# Patient Record
Sex: Female | Born: 1955 | Race: White | Hispanic: No | Marital: Married | Smoking: Never smoker
Health system: Southern US, Community
[De-identification: ages and names within clinical notes are randomized; demographics above are authoritative.]

## PROBLEM LIST (undated history)

## (undated) DIAGNOSIS — M1990 Unspecified osteoarthritis, unspecified site: Secondary | ICD-10-CM

## (undated) DIAGNOSIS — E785 Hyperlipidemia, unspecified: Secondary | ICD-10-CM

## (undated) DIAGNOSIS — F419 Anxiety disorder, unspecified: Secondary | ICD-10-CM

## (undated) DIAGNOSIS — C801 Malignant (primary) neoplasm, unspecified: Secondary | ICD-10-CM

## (undated) DIAGNOSIS — J189 Pneumonia, unspecified organism: Secondary | ICD-10-CM

## (undated) DIAGNOSIS — F32A Depression, unspecified: Secondary | ICD-10-CM

## (undated) DIAGNOSIS — K429 Umbilical hernia without obstruction or gangrene: Secondary | ICD-10-CM

## (undated) DIAGNOSIS — M810 Age-related osteoporosis without current pathological fracture: Secondary | ICD-10-CM

## (undated) HISTORY — DX: Age-related osteoporosis without current pathological fracture: M81.0

## (undated) HISTORY — DX: Hyperlipidemia, unspecified: E78.5

## (undated) HISTORY — DX: Anxiety disorder, unspecified: F41.9

## (undated) HISTORY — PX: NO PAST SURGERIES: SHX2092

## (undated) HISTORY — PX: COLONOSCOPY: SHX174

---

## 1978-12-22 HISTORY — PX: DIAGNOSTIC LAPAROSCOPY: SUR761

## 2021-05-21 ENCOUNTER — Encounter: Payer: Self-pay | Admitting: Adult Health

## 2021-05-21 ENCOUNTER — Ambulatory Visit (INDEPENDENT_AMBULATORY_CARE_PROVIDER_SITE_OTHER): Payer: Medicare Other | Admitting: Adult Health

## 2021-05-21 ENCOUNTER — Other Ambulatory Visit: Payer: Self-pay

## 2021-05-21 VITALS — BP 124/78 | HR 68 | Temp 98.2°F | Ht 67.0 in | Wt 178.4 lb

## 2021-05-21 DIAGNOSIS — F339 Major depressive disorder, recurrent, unspecified: Secondary | ICD-10-CM

## 2021-05-21 DIAGNOSIS — I5041 Acute combined systolic (congestive) and diastolic (congestive) heart failure: Secondary | ICD-10-CM

## 2021-05-21 DIAGNOSIS — R239 Unspecified skin changes: Secondary | ICD-10-CM | POA: Diagnosis not present

## 2021-05-21 DIAGNOSIS — M069 Rheumatoid arthritis, unspecified: Secondary | ICD-10-CM | POA: Diagnosis not present

## 2021-05-21 DIAGNOSIS — E559 Vitamin D deficiency, unspecified: Secondary | ICD-10-CM

## 2021-05-21 DIAGNOSIS — Z1231 Encounter for screening mammogram for malignant neoplasm of breast: Secondary | ICD-10-CM | POA: Diagnosis not present

## 2021-05-21 DIAGNOSIS — E785 Hyperlipidemia, unspecified: Secondary | ICD-10-CM

## 2021-05-21 DIAGNOSIS — L570 Actinic keratosis: Secondary | ICD-10-CM | POA: Diagnosis not present

## 2021-05-21 DIAGNOSIS — I341 Nonrheumatic mitral (valve) prolapse: Secondary | ICD-10-CM | POA: Insufficient documentation

## 2021-05-21 DIAGNOSIS — F320 Major depressive disorder, single episode, mild: Secondary | ICD-10-CM

## 2021-05-21 DIAGNOSIS — M8580 Other specified disorders of bone density and structure, unspecified site: Secondary | ICD-10-CM | POA: Diagnosis not present

## 2021-05-21 NOTE — Progress Notes (Signed)
New Patient Office Visit  Subjective:  Patient ID: Misty Walker, female    DOB: 1956/11/09  Age: 65 y.o. MRN: [id number]  CC:  Chief Complaint  Patient presents with  . New Patient (Initial Visit)    HPI [name] presents for establishment of care. She has a spot on her chest and right arm. Chest area present x 4 years and right arm for about 6 months. No itching. Denies any bleeding. Scales over and then goes away.   She recently moved here from [place].    She has had nerve conduction study on left big toe and leg and noted nerve in leg was not firing. She did physical therapy and can now move her toes. Nerve conduction.   Fluoxetine 10 mg once daily. She has been on this for a while now.  Atenolol 25 mg once daily for MVP she has not seen cardiology in 20 years and ECHO was unchanged, 1 year ago per patient. Denies any shortness of breath. Denies any chest pain.  simivastin 5 mg po qd for hyperlipidemia.  [facility] [place] [facility]   Mammogram 2 years ago.   Colonoscopy was 2014. Did not go.   Patient  denies any fever, body aches,chills, rash, chest pain, shortness of breath, nausea, vomiting, or diarrhea.  Denies dizziness, lightheadedness, pre syncopal or syncopal episodes.     History reviewed. No pertinent past medical history.  History reviewed. No pertinent surgical history.  Family History  Problem Relation Age of Onset  . Hypertension Mother     Social History   Socioeconomic History  . Marital status: Married    Spouse name: Not on file  . Number of children: Not on file  . Years of education: Not on file  . Highest education level: Not on file  Occupational History  . Not on file  Tobacco Use  . Smoking status: Never Smoker  . Smokeless tobacco: Never Used  Substance and Sexual Activity  . Alcohol use: Not on file  . Drug use: Never  . Sexual activity: Yes  Other Topics Concern  . Not on file  Social History Narrative  . Not on file    Social Determinants of Health   Financial Resource Strain: Not on file  Food Insecurity: Not on file  Transportation Needs: Not on file  Physical Activity: Not on file  Stress: Not on file  Social Connections: Not on file  Intimate Partner Violence: Not on file    ROS Review of Systems  Constitutional: Negative.   HENT: Negative.   Cardiovascular: Negative.   Gastrointestinal: Negative.   Genitourinary: Negative.   Musculoskeletal: Negative.   Skin: Positive for color change.  Hematological: Negative.   Psychiatric/Behavioral: Negative.     Objective:   Today's Vitals: BP 124/78   Pulse 68   Temp 98.2 F (36.8 C)   Ht 5\' 7"  (1.702 m)   Wt 178 lb 6.4 oz (80.9 kg)   SpO2 95%   BMI 27.94 kg/m   Physical Exam Vitals reviewed.  Constitutional:      General: She is not in acute distress.    Appearance: She is well-developed. She is not diaphoretic.     Interventions: She is not intubated. HENT:     Head: Normocephalic and atraumatic.     Right Ear: External ear normal.     Left Ear: External ear normal.     Nose: Nose normal.     Mouth/Throat:     Pharynx:  No oropharyngeal exudate.  Eyes:     General: Lids are normal. No scleral icterus.       Right eye: No discharge.        Left eye: No discharge.     Conjunctiva/sclera: Conjunctivae normal.     Right eye: Right conjunctiva is not injected. No exudate or hemorrhage.    Left eye: Left conjunctiva is not injected. No exudate or hemorrhage.    Pupils: Pupils are equal, round, and reactive to light.  Neck:     Thyroid: No thyroid mass or thyromegaly.     Vascular: Normal carotid pulses. No carotid bruit, hepatojugular reflux or JVD.     Trachea: Trachea and phonation normal. No tracheal tenderness or tracheal deviation.     Meningeal: Brudzinski's sign and Kernig's sign absent.  Cardiovascular:     Rate and Rhythm: Normal rate and regular rhythm.     Pulses: Normal pulses.          Radial pulses are 2+ on  the right side and 2+ on the left side.       Dorsalis pedis pulses are 2+ on the right side and 2+ on the left side.       Posterior tibial pulses are 2+ on the right side and 2+ on the left side.     Heart sounds: S1 normal and S2 normal. Heart sounds not distant. Murmur (mid systolic murmur with click ) heard.  No friction rub. No gallop.   Pulmonary:     Effort: Pulmonary effort is normal. No tachypnea, bradypnea, accessory muscle usage or respiratory distress. She is not intubated.     Breath sounds: Normal breath sounds. No stridor. No wheezing or rales.  Chest:     Chest wall: No tenderness.  Breasts:     Right: No supraclavicular adenopathy.     Left: No supraclavicular adenopathy.    Abdominal:     General: Bowel sounds are normal. There is no distension or abdominal bruit.     Palpations: Abdomen is soft. There is no shifting dullness, fluid wave, hepatomegaly, splenomegaly, mass or pulsatile mass.     Tenderness: There is no abdominal tenderness. There is no guarding or rebound.     Hernia: No hernia is present.  Musculoskeletal:        General: No tenderness or deformity. Normal range of motion.     Cervical back: Full passive range of motion without pain, normal range of motion and neck supple. No edema, erythema or rigidity. No spinous process tenderness or muscular tenderness. Normal range of motion.     Right lower leg: 1+ Pitting Edema present.     Left lower leg: 1+ Pitting Edema present.  Lymphadenopathy:     Head:     Right side of head: No submental, submandibular, tonsillar, preauricular, posterior auricular or occipital adenopathy.     Left side of head: No submental, submandibular, tonsillar, preauricular, posterior auricular or occipital adenopathy.     Cervical: No cervical adenopathy.     Right cervical: No superficial, deep or posterior cervical adenopathy.    Left cervical: No superficial, deep or posterior cervical adenopathy.     Upper Body:     Right  upper body: No supraclavicular or pectoral adenopathy.     Left upper body: No supraclavicular or pectoral adenopathy.  Skin:    General: Skin is warm and dry.     Coloration: Skin is not pale.     Findings: No abrasion, bruising, burn,  ecchymosis, erythema, lesion, petechiae or rash.     Nails: There is no clubbing.       Neurological:     Mental Status: She is alert and oriented to person, place, and time.     GCS: GCS eye subscore is 4. GCS verbal subscore is 5. GCS motor subscore is 6.     Cranial Nerves: No cranial nerve deficit.     Sensory: No sensory deficit.     Motor: No tremor, atrophy, abnormal muscle tone or seizure activity.     Coordination: Coordination normal.     Gait: Gait normal.     Deep Tendon Reflexes: Reflexes are normal and symmetric. Reflexes normal. Babinski sign absent on the right side. Babinski sign absent on the left side.     Reflex Scores:      Tricep reflexes are 2+ on the right side and 2+ on the left side.      Bicep reflexes are 2+ on the right side and 2+ on the left side.      Brachioradialis reflexes are 2+ on the right side and 2+ on the left side.      Patellar reflexes are 2+ on the right side and 2+ on the left side.      Achilles reflexes are 2+ on the right side and 2+ on the left side. Psychiatric:        Speech: Speech normal.        Behavior: Behavior normal.        Thought Content: Thought content normal.        Judgment: Judgment normal.     Assessment & Plan:   Problem List Items Addressed This Visit      Cardiovascular and Mediastinum   Acute combined systolic and diastolic heart failure ([place])   Relevant Orders   Ambulatory referral to Cardiology     Musculoskeletal and Integument   Rheumatoid arthritis ([place])     Other   Depression, recurrent ([place])    Other Visit Diagnoses    Hyperlipidemia, unspecified hyperlipidemia type    -  Primary   Relevant Orders   Lipid Panel w/o Chol/HDL Ratio   Screening mammogram,  encounter for       Relevant Orders   MM Digital Diagnostic Bilat   Mitral valve prolapse       Relevant Orders   VITAMIN D 25 Hydroxy (Vit-D Deficiency, Fractures)   CBC with Differential/Platelet   Comprehensive metabolic panel   Ambulatory referral to Cardiology   Osteopenia, unspecified location       Relevant Orders   DG Bone Density   Vitamin D deficiency       Depression, major, single episode, mild ([facility])       Relevant Orders   VITAMIN D 25 Hydroxy (Vit-D Deficiency, Fractures)   CBC with Differential/Platelet   Comprehensive metabolic panel   TSH   Actinic keratoses       Relevant Orders   Ambulatory referral to Dermatology   Abnormal appearance of skin       Relevant Orders   Ambulatory referral to Dermatology      Outpatient Encounter Medications as of 05/21/2021  Medication Sig  . [DISCONTINUED] atenolol (TENORMIN) 25 MG tablet Take by mouth daily.  . [DISCONTINUED] FLUoxetine (PROZAC) 10 MG tablet Take 10 mg by mouth daily.  . [DISCONTINUED] simvastatin (ZOCOR) 5 MG tablet Take 5 mg by mouth daily.   No facility-administered encounter medications on file as of 05/21/2021.  Orders Placed This Encounter  Procedures  . MM Digital Diagnostic Bilat    Order Specific Question:   Reason for Exam (SYMPTOM  OR DIAGNOSIS REQUIRED)    Answer:   screening.    Order Specific Question:   Preferred imaging location?    Answer:   [facility]  . DG Bone Density    Order Specific Question:   Reason for Exam (SYMPTOM  OR DIAGNOSIS REQUIRED)    Answer:   screening has osteopenia.    Order Specific Question:   Preferred imaging location?    Answer:   [facility]  . VITAMIN D 25 Hydroxy (Vit-D Deficiency, Fractures)    Standing Status:   Future    Standing Expiration Date:   05/21/2022  . CBC with Differential/Platelet    Standing Status:   Future    Standing Expiration Date:   05/21/2022  . Comprehensive metabolic panel    Standing Status:   Future     Standing Expiration Date:   05/21/2022  . TSH    Standing Status:   Future    Standing Expiration Date:   05/21/2022  . Lipid Panel w/o Chol/HDL Ratio    Standing Status:   Future    Standing Expiration Date:   05/21/2022  . Ambulatory referral to Dermatology    Referral Priority:   Urgent    Referral Type:   Consultation    Referral Reason:   Specialty Services Required    Referred to Provider:   Jannet Mantis, MD    Requested Specialty:   Dermatology    Number of Visits Requested:   1  . Ambulatory referral to Cardiology    Referral Priority:   Routine    Referral Type:   Consultation    Referral Reason:   Specialty Services Required    Requested Specialty:   Cardiology    Number of Visits Requested:   1    Follow-up: Return in about 4 months (around 09/20/2021), or if symptoms worsen or fail to improve, for at any time for any worsening symptoms, Go to Emergency room/ urgent care if worse.   Marcille Buffy, FNP

## 2021-05-21 NOTE — Patient Instructions (Signed)
Call to schedule your screening mammogram. Your orders have been placed for your exam.  Let our office know if you have questions, concerns, or any difficulty scheduling.  If normal results then yearly screening mammograms are recommended unless you notice  Changes in your breast then you should schedule a follow up office visit. If abnormal results  Further imaging will be warranted and sooner follow up as determined by the radiologist at the [facility].   [facility] at [facility] [place], [place] [address]  Main: [phone]    Health Maintenance, Female Adopting a healthy lifestyle and getting preventive care are important in promoting health and wellness. Ask your health care provider about:  The right schedule for you to have regular tests and exams.  Things you can do on your own to prevent diseases and keep yourself healthy. What should I know about diet, weight, and exercise? Eat a healthy diet  Eat a diet that includes plenty of vegetables, fruits, low-fat dairy products, and lean protein.  Do not eat a lot of foods that are high in solid fats, added sugars, or sodium.   Maintain a healthy weight Body mass index (BMI) is used to identify weight problems. It estimates body fat based on height and weight. Your health care provider can help determine your BMI and help you achieve or maintain a healthy weight. Get regular exercise Get regular exercise. This is one of the most important things you can do for your health. Most adults should:  Exercise for at least 150 minutes each week. The exercise should increase your heart rate and make you sweat (moderate-intensity exercise).  Do strengthening exercises at least twice a week. This is in addition to the moderate-intensity exercise.  Spend less time sitting. Even light physical activity can be beneficial. Watch cholesterol and blood lipids Have your blood tested for lipids and  cholesterol at 67 years of age, then have this test every 5 years. Have your cholesterol levels checked more often if:  Your lipid or cholesterol levels are high.  You are older than 67 years of age.  You are at high risk for heart disease. What should I know about cancer screening? Depending on your health history and family history, you may need to have cancer screening at various ages. This may include screening for:  Breast cancer.  Cervical cancer.  Colorectal cancer.  Skin cancer.  Lung cancer. What should I know about heart disease, diabetes, and high blood pressure? Blood pressure and heart disease  High blood pressure causes heart disease and increases the risk of stroke. This is more likely to develop in people who have high blood pressure readings, are of African descent, or are overweight.  Have your blood pressure checked: ? Every 3-5 years if you are 15-28 years of age. ? Every year if you are 82 years old or older. Diabetes Have regular diabetes screenings. This checks your fasting blood sugar level. Have the screening done:  Once every three years after age 25 if you are at a normal weight and have a low risk for diabetes.  More often and at a younger age if you are overweight or have a high risk for diabetes. What should I know about preventing infection? Hepatitis B If you have a higher risk for hepatitis B, you should be screened for this virus. Talk with your health care provider to find out if you are at risk for hepatitis B  infection. Hepatitis C Testing is recommended for:  Everyone born from [birth year] through 1967.  Anyone with known risk factors for hepatitis C. Sexually transmitted infections (STIs)  Get screened for STIs, including gonorrhea and chlamydia, if: ? You are sexually active and are younger than 67 years of age. ? You are older than 66 years of age and your health care provider tells you that you are at risk for this type of  infection. ? Your sexual activity has changed since you were last screened, and you are at increased risk for chlamydia or gonorrhea. Ask your health care provider if you are at risk.  Ask your health care provider about whether you are at high risk for HIV. Your health care provider may recommend a prescription medicine to help prevent HIV infection. If you choose to take medicine to prevent HIV, you should first get tested for HIV. You should then be tested every 3 months for as long as you are taking the medicine. Pregnancy  If you are about to stop having your period (premenopausal) and you may become pregnant, seek counseling before you get pregnant.  Take 400 to 800 micrograms (mcg) of folic acid every day if you become pregnant.  Ask for birth control (contraception) if you want to prevent pregnancy. Osteoporosis and menopause Osteoporosis is a disease in which the bones lose minerals and strength with aging. This can result in bone fractures. If you are 70 years old or older, or if you are at risk for osteoporosis and fractures, ask your health care provider if you should:  Be screened for bone loss.  Take a calcium or vitamin D supplement to lower your risk of fractures.  Be given hormone replacement therapy (HRT) to treat symptoms of menopause. Follow these instructions at home: Lifestyle  Do not use any products that contain nicotine or tobacco, such as cigarettes, e-cigarettes, and chewing tobacco. If you need help quitting, ask your health care provider.  Do not use street drugs.  Do not share needles.  Ask your health care provider for help if you need support or information about quitting drugs. Alcohol use  Do not drink alcohol if: ? Your health care provider tells you not to drink. ? You are pregnant, may be pregnant, or are planning to become pregnant.  If you drink alcohol: ? Limit how much you use to 0-1 drink a day. ? Limit intake if you are  breastfeeding.  Be aware of how much alcohol is in your drink. In the [place] one drink equals one 12 oz bottle of beer (355 mL), one 5 oz glass of wine (148 mL), or one 1 oz glass of hard liquor (44 mL). General instructions  Schedule regular health, dental, and eye exams.  Stay current with your vaccines.  Tell your health care provider if: ? You often feel depressed. ? You have ever been abused or do not feel safe at home. Summary  Adopting a healthy lifestyle and getting preventive care are important in promoting health and wellness.  Follow your health care provider's instructions about healthy diet, exercising, and getting tested or screened for diseases.  Follow your health care provider's instructions on monitoring your cholesterol and blood pressure. This information is not intended to replace advice given to you by your health care provider. Make sure you discuss any questions you have with your health care provider. Document Revised: 12/01/2018 Document Reviewed: 12/01/2018 Elsevier Patient Education  2021 [employer].

## 2021-05-22 ENCOUNTER — Telehealth: Payer: Self-pay | Admitting: Adult Health

## 2021-05-22 ENCOUNTER — Encounter: Payer: Self-pay | Admitting: Adult Health

## 2021-05-22 MED ORDER — SIMVASTATIN 5 MG PO TABS: 5.0000 mg | ORAL_TABLET | Freq: Every day | ORAL | 1 refills | Status: DC

## 2021-05-22 MED ORDER — ATENOLOL 25 MG PO TABS: 25.0000 mg | ORAL_TABLET | Freq: Every day | ORAL | 1 refills | Status: DC

## 2021-05-22 MED ORDER — FLUOXETINE HCL 10 MG PO TABS: 10.0000 mg | ORAL_TABLET | Freq: Every day | ORAL | 1 refills | Status: DC

## 2021-05-22 NOTE — Addendum Note (Signed)
Addended by: Nanci Pina on: 05/22/2021 12:24 PM   Modules accepted: Orders

## 2021-05-22 NOTE — Progress Notes (Signed)
Updates were made. Provider was able to close chart.

## 2021-05-22 NOTE — Telephone Encounter (Signed)
Refill sent.

## 2021-05-22 NOTE — Telephone Encounter (Signed)
Patient established with NP on 05/21/21 okay to fill the listed meds?

## 2021-05-22 NOTE — Telephone Encounter (Signed)
Ok to fill 90 day supply. Thanks

## 2021-05-22 NOTE — Telephone Encounter (Signed)
Pt needs refill on the following [facility]  atenolol (TENORMIN) 25 MG tablet FLUoxetine (PROZAC) 10 MG tablet simvastatin (ZOCOR) 5 MG tablet

## 2021-05-24 ENCOUNTER — Telehealth: Payer: Self-pay

## 2021-05-24 NOTE — Telephone Encounter (Signed)
Returned pt call  

## 2021-05-24 NOTE — Telephone Encounter (Signed)
Left message to call back about cardiology referral.

## 2021-05-24 NOTE — Telephone Encounter (Signed)
Pt returned your call.  

## 2021-05-27 ENCOUNTER — Other Ambulatory Visit: Payer: Self-pay | Admitting: Adult Health

## 2021-05-27 DIAGNOSIS — Z1231 Encounter for screening mammogram for malignant neoplasm of breast: Secondary | ICD-10-CM

## 2021-05-30 DIAGNOSIS — D0461 Carcinoma in situ of skin of right upper limb, including shoulder: Secondary | ICD-10-CM | POA: Diagnosis not present

## 2021-05-31 ENCOUNTER — Other Ambulatory Visit: Payer: Self-pay

## 2021-05-31 ENCOUNTER — Other Ambulatory Visit (INDEPENDENT_AMBULATORY_CARE_PROVIDER_SITE_OTHER): Payer: Medicare Other

## 2021-05-31 DIAGNOSIS — I341 Nonrheumatic mitral (valve) prolapse: Secondary | ICD-10-CM | POA: Diagnosis not present

## 2021-05-31 DIAGNOSIS — E785 Hyperlipidemia, unspecified: Secondary | ICD-10-CM

## 2021-05-31 DIAGNOSIS — L821 Other seborrheic keratosis: Secondary | ICD-10-CM | POA: Diagnosis not present

## 2021-05-31 DIAGNOSIS — F320 Major depressive disorder, single episode, mild: Secondary | ICD-10-CM

## 2021-05-31 DIAGNOSIS — D485 Neoplasm of uncertain behavior of skin: Secondary | ICD-10-CM | POA: Diagnosis not present

## 2021-05-31 LAB — CBC WITH DIFFERENTIAL/PLATELET
Basophils Absolute: 0.1 10*3/uL (ref 0.0–0.1)
Basophils Relative: 0.8 % (ref 0.0–3.0)
Eosinophils Absolute: 0.3 10*3/uL (ref 0.0–0.7)
Eosinophils Relative: 3 % (ref 0.0–5.0)
HCT: 41.8 % (ref 36.0–46.0)
Hemoglobin: 14.2 g/dL (ref 12.0–15.0)
Lymphocytes Relative: 20.1 % (ref 12.0–46.0)
Lymphs Abs: 1.7 10*3/uL (ref 0.7–4.0)
MCHC: 34 g/dL (ref 30.0–36.0)
MCV: 93.6 fl (ref 78.0–100.0)
Monocytes Absolute: 0.8 10*3/uL (ref 0.1–1.0)
Monocytes Relative: 9.7 % (ref 3.0–12.0)
Neutro Abs: 5.6 10*3/uL (ref 1.4–7.7)
Neutrophils Relative %: 66.4 % (ref 43.0–77.0)
Platelets: 300 10*3/uL (ref 150.0–400.0)
RBC: 4.47 Mil/uL (ref 3.87–5.11)
RDW: 12.7 % (ref 11.5–15.5)
WBC: 8.5 10*3/uL (ref 4.0–10.5)

## 2021-05-31 LAB — COMPREHENSIVE METABOLIC PANEL
ALT: 22 U/L (ref 0–35)
AST: 20 U/L (ref 0–37)
Albumin: 4.3 g/dL (ref 3.5–5.2)
Alkaline Phosphatase: 78 U/L (ref 39–117)
BUN: 14 mg/dL (ref 6–23)
CO2: 26 mEq/L (ref 19–32)
Calcium: 8.9 mg/dL (ref 8.4–10.5)
Chloride: 101 mEq/L (ref 96–112)
Creatinine, Ser: 0.77 mg/dL (ref 0.40–1.20)
GFR: 79.96 mL/min (ref >60.00)
Glucose, Bld: 90 mg/dL (ref 70–99)
Potassium: 3.9 mEq/L (ref 3.5–5.1)
Sodium: 136 mEq/L (ref 135–145)
Total Bilirubin: 0.6 mg/dL (ref 0.2–1.2)
Total Protein: 6.6 g/dL (ref 6.0–8.3)

## 2021-05-31 LAB — TSH: TSH: 2 u[IU]/mL (ref 0.35–4.50)

## 2021-05-31 LAB — VITAMIN D 25 HYDROXY (VIT D DEFICIENCY, FRACTURES): VITD: 40.09 ng/mL (ref 30.00–100.00)

## 2021-06-01 LAB — LIPID PANEL W/O CHOL/HDL RATIO
Cholesterol, Total: 160 mg/dL (ref 100–199)
HDL: 57 mg/dL (ref >39)
LDL Chol Calc (NIH): 88 mg/dL (ref 0–99)
Triglycerides: 76 mg/dL (ref 0–149)
VLDL Cholesterol Cal: 15 mg/dL (ref 5–40)

## 2021-06-03 NOTE — Progress Notes (Signed)
All labs that were checked are within normal limits.  Cholesterol within normal limits.  CBC is within normal limits.  CMP is within normal limits.  Vitamin D level is within normal limits.  TSH for thyroid is within normal limits.

## 2021-06-20 DIAGNOSIS — C44622 Squamous cell carcinoma of skin of right upper limb, including shoulder: Secondary | ICD-10-CM | POA: Diagnosis not present

## 2021-06-27 ENCOUNTER — Encounter: Payer: Self-pay | Admitting: Cardiology

## 2021-06-27 ENCOUNTER — Other Ambulatory Visit: Payer: Self-pay

## 2021-06-27 ENCOUNTER — Ambulatory Visit: Payer: Medicare Other | Admitting: Cardiology

## 2021-06-27 VITALS — BP 114/74 | HR 67 | Ht 67.0 in | Wt 179.0 lb

## 2021-06-27 DIAGNOSIS — Z8679 Personal history of other diseases of the circulatory system: Secondary | ICD-10-CM | POA: Diagnosis not present

## 2021-06-27 DIAGNOSIS — E7800 Pure hypercholesterolemia, unspecified: Secondary | ICD-10-CM

## 2021-06-27 NOTE — Progress Notes (Signed)
Cardiology Office Note:    Date:  06/27/2021   ID:  Misty Walker, DOB December 25, 1955, MRN [id number]  PCP:  Doreen Beam, FNP   [facility] Providers Cardiologist:  Kate Sable, MD     Referring MD: Sharmon Leyden*   Chief Complaint  Patient presents with   New Patient (Initial Visit)    Referred by PCP for MVP. Meds reviewed verbally with patient.     History of Present Illness:    Misty Walker is a 65 y.o. female with a hx of hyperlipidemia, mitral valve prolapse who presents to establish care.    Patient had palpitations and dizziness in her 75s.  Work-up with echocardiogram at that time revealed mitral valve prolapse.  She was started on atenolol with resolution of palpitations and dizziness.  She has been on atenolol over the past 40+ years.  Recently moved to the area from [place].  Had echocardiograms performed serially, last echo was about 3 years ago.  States echo did not show any gross abnormalities.  Denies chest pain, shortness of breath, personal or family history of heart disease.   Past Medical History:  Diagnosis Date   Hyperlipidemia     History reviewed. No pertinent surgical history.  Current Medications: Current Meds  Medication Sig   atenolol (TENORMIN) 25 MG tablet Take 1 tablet (25 mg total) by mouth daily.   FLUoxetine (PROZAC) 10 MG tablet Take 1 tablet (10 mg total) by mouth daily.   simvastatin (ZOCOR) 5 MG tablet Take 1 tablet (5 mg total) by mouth daily.     Allergies:   Patient has no known allergies.   Social History   Socioeconomic History   Marital status: Married    Spouse name: Not on file   Number of children: Not on file   Years of education: Not on file   Highest education level: Not on file  Occupational History   Not on file  Tobacco Use   Smoking status: Never   Smokeless tobacco: Never  Substance and Sexual Activity   Alcohol use: Not on file   Drug use: Never   Sexual activity: Yes  Other Topics  Concern   Not on file  Social History Narrative   Not on file   Social Determinants of Health   Financial Resource Strain: Not on file  Food Insecurity: Not on file  Transportation Needs: Not on file  Physical Activity: Not on file  Stress: Not on file  Social Connections: Not on file     Family History: The patient's family history includes Hypertension in her mother.  ROS:   Please see the history of present illness.     All other systems reviewed and are negative.  EKGs/Labs/Other Studies Reviewed:    The following studies were reviewed today:   EKG:  EKG is  ordered today.  The ekg ordered today demonstrates normal sinus rhythm  Recent Labs: 05/31/2021: ALT 22; BUN 14; Creatinine, Ser 0.77; Hemoglobin 14.2; Platelets 300.0; Potassium 3.9; Sodium 136; TSH 2.00  Recent Lipid Panel    Component Value Date/Time   CHOL 160 05/31/2021 0834   TRIG 76 05/31/2021 0834   HDL 57 05/31/2021 0834   LDLCALC 88 05/31/2021 0834     Risk Assessment/Calculations:         Physical Exam:    VS:  BP 114/74 (BP Location: Left Arm, Patient Position: Sitting, Cuff Size: Normal)   Pulse 67   Ht 5\' 7"  (1.702 m)  Wt 179 lb (81.2 kg)   SpO2 97%   BMI 28.04 kg/m     Wt Readings from Last 3 Encounters:  06/27/21 179 lb (81.2 kg)  05/21/21 178 lb 6.4 oz (80.9 kg)     GEN:  Well nourished, well developed in no acute distress HEENT: Normal NECK: No JVD; No carotid bruits LYMPHATICS: No lymphadenopathy CARDIAC: RRR, no murmurs, rubs, gallops RESPIRATORY:  Clear to auscultation without rales, wheezing or rhonchi  ABDOMEN: Soft, non-tender, non-distended MUSCULOSKELETAL:  No edema; No deformity  SKIN: Warm and dry NEUROLOGIC:  Alert and oriented x 3 PSYCHIATRIC:  Normal affect   ASSESSMENT:    1. Hx of mitral valve prolapse   2. Pure hypercholesterolemia    PLAN:    In order of problems listed above:  States having history of mitral valve prolapse.  Obtain echo to  evaluate presence of mitral valve prolapse or any other structural abnormalities. Hyperlipidemia, continue statin.  Follow-up after echo    Medication Adjustments/Labs and Tests Ordered: Current medicines are reviewed at length with the patient today.  Concerns regarding medicines are outlined above.  Orders Placed This Encounter  Procedures   EKG 12-Lead   ECHOCARDIOGRAM COMPLETE   No orders of the defined types were placed in this encounter.   Patient Instructions  Medication Instructions:  Your physician recommends that you continue on your current medications as directed. Please refer to the Current Medication list given to you today.  *If you need a refill on your cardiac medications before your next appointment, please call your pharmacy*   Lab Work:  None ordered  If you have labs (blood work) drawn today and your tests are completely normal, you will receive your results only by: [place] (if you have MyChart) OR A paper copy in the mail If you have any lab test that is abnormal or we need to change your treatment, we will call you to review the results.   Testing/Procedures:  Your physician has requested that you have an echocardiogram. Echocardiography is a painless test that uses sound waves to create images of your heart. It provides your doctor with information about the size and shape of your heart and how well your heart's chambers and valves are working. This procedure takes approximately one hour. There are no restrictions for this procedure.    Follow-Up: At [facility], you and your health needs are our priority.  As part of our continuing mission to provide you with exceptional heart care, we have created designated Provider Care Teams.  These Care Teams include your primary Cardiologist (physician) and Advanced Practice Providers (APPs -  Physician Assistants and Nurse Practitioners) who all work together to provide you with the care you need,  when you need it.  We recommend signing up for the patient portal called "MyChart".  Sign up information is provided on this After Visit Summary.  MyChart is used to connect with patients for Virtual Visits (Telemedicine).  Patients are able to view lab/test results, encounter notes, upcoming appointments, etc.  Non-urgent messages can be sent to your provider as well.   To learn more about what you can do with MyChart, go to [email or web address].    Your next appointment:   Follow up after Echo   The format for your next appointment:   In Person  Provider:   Kate Sable, MD   Other Instructions    Signed, Kate Sable, MD  06/27/2021 11:37 AM    [place]  [facility]  

## 2021-06-27 NOTE — Patient Instructions (Signed)

## 2021-07-23 ENCOUNTER — Other Ambulatory Visit: Payer: Self-pay

## 2021-07-23 ENCOUNTER — Ambulatory Visit (INDEPENDENT_AMBULATORY_CARE_PROVIDER_SITE_OTHER): Payer: Medicare Other

## 2021-07-23 DIAGNOSIS — I341 Nonrheumatic mitral (valve) prolapse: Secondary | ICD-10-CM

## 2021-07-23 DIAGNOSIS — Z8679 Personal history of other diseases of the circulatory system: Secondary | ICD-10-CM | POA: Diagnosis not present

## 2021-07-23 LAB — ECHOCARDIOGRAM COMPLETE
AR max vel: 2.21 cm2
AV Area VTI: 2.5 cm2
AV Area mean vel: 2.33 cm2
AV Mean grad: 3 mmHg
AV Peak grad: 6.9 mmHg
Ao pk vel: 1.31 m/s
Area-P 1/2: 3.1 cm2
S' Lateral: 2.7 cm
Single Plane A4C EF: 54.8 %

## 2021-08-13 ENCOUNTER — Other Ambulatory Visit: Payer: Self-pay

## 2021-08-13 ENCOUNTER — Telehealth: Payer: Self-pay

## 2021-08-13 MED ORDER — ATENOLOL 25 MG PO TABS: 25.0000 mg | ORAL_TABLET | Freq: Every day | ORAL | 1 refills | Status: DC

## 2021-08-13 MED ORDER — FLUOXETINE HCL 10 MG PO TABS: 10.0000 mg | ORAL_TABLET | Freq: Every day | ORAL | 1 refills | Status: DC

## 2021-08-13 MED ORDER — SIMVASTATIN 5 MG PO TABS: 5.0000 mg | ORAL_TABLET | Freq: Every day | ORAL | 1 refills | Status: DC

## 2021-08-13 NOTE — Telephone Encounter (Signed)
Refill sent to Mail order.

## 2021-08-13 NOTE — Telephone Encounter (Signed)
Pt needs to change her pharmacy to [facility] home delivery. She also needs all three of her meds refilled. Pt only has a couple left of each. Pt wants CAPSULES of fluoxetine. Not Tablets.

## 2021-08-14 NOTE — Telephone Encounter (Signed)
Pt needs capsules for Fluoxetine-not tablets. Pt is getting low and insurance is only wanting to cover capsules

## 2021-08-15 ENCOUNTER — Other Ambulatory Visit: Payer: Self-pay

## 2021-08-15 ENCOUNTER — Encounter: Payer: Self-pay | Admitting: Cardiology

## 2021-08-15 ENCOUNTER — Ambulatory Visit: Payer: Medicare Other | Admitting: Cardiology

## 2021-08-15 VITALS — BP 118/70 | HR 63 | Ht 67.0 in | Wt 178.0 lb

## 2021-08-15 DIAGNOSIS — E7800 Pure hypercholesterolemia, unspecified: Secondary | ICD-10-CM

## 2021-08-15 DIAGNOSIS — Z8679 Personal history of other diseases of the circulatory system: Secondary | ICD-10-CM | POA: Diagnosis not present

## 2021-08-15 MED ORDER — FLUOXETINE HCL 10 MG PO CAPS: 10.0000 mg | ORAL_CAPSULE | Freq: Every day | ORAL | 1 refills | Status: DC

## 2021-08-15 NOTE — Progress Notes (Signed)
Cardiology Office Note:    Date:  08/15/2021   ID:  Misty Walker, DOB Sep 08, 1956, MRN [id number]  PCP:  Doreen Beam, FNP   [facility] Providers Cardiologist:  Kate Sable, MD     Referring MD: Sharmon Leyden*   Chief Complaint  Patient presents with   Other    Follow up post ECHO -- Meds reviewed verbally with patient.     History of Present Illness:    Misty Walker is a 65 y.o. female with a hx of hyperlipidemia, mitral valve prolapse who presents for follow-up.    Previously seen to establish care due to history of mitral valve prolapse.  She feels well, denies chest pain or shortness of breath.  Echocardiogram was ordered to evaluate structural abnormalities, MR.  Has no new concerns at this time.  Presents for echo results.  Takes all medications as prescribed   Past Medical History:  Diagnosis Date   Hyperlipidemia     History reviewed. No pertinent surgical history.  Current Medications: Current Meds  Medication Sig   atenolol (TENORMIN) 25 MG tablet Take 1 tablet (25 mg total) by mouth daily.   FLUoxetine (PROZAC) 10 MG capsule Take 1 capsule (10 mg total) by mouth daily.   simvastatin (ZOCOR) 5 MG tablet Take 1 tablet (5 mg total) by mouth daily.     Allergies:   Patient has no known allergies.   Social History   Socioeconomic History   Marital status: Married    Spouse name: Not on file   Number of children: Not on file   Years of education: Not on file   Highest education level: Not on file  Occupational History   Not on file  Tobacco Use   Smoking status: Never   Smokeless tobacco: Never  Substance and Sexual Activity   Alcohol use: Not on file   Drug use: Never   Sexual activity: Yes  Other Topics Concern   Not on file  Social History Narrative   Not on file   Social Determinants of Health   Financial Resource Strain: Not on file  Food Insecurity: Not on file  Transportation Needs: Not on file  Physical Activity:  Not on file  Stress: Not on file  Social Connections: Not on file     Family History: The patient's family history includes Hypertension in her mother.  ROS:   Please see the history of present illness.     All other systems reviewed and are negative.  EKGs/Labs/Other Studies Reviewed:    The following studies were reviewed today:   EKG:  EKG not ordered today.    Recent Labs: 05/31/2021: ALT 22; BUN 14; Creatinine, Ser 0.77; Hemoglobin 14.2; Platelets 300.0; Potassium 3.9; Sodium 136; TSH 2.00  Recent Lipid Panel    Component Value Date/Time   CHOL 160 05/31/2021 0834   TRIG 76 05/31/2021 0834   HDL 57 05/31/2021 0834   LDLCALC 88 05/31/2021 0834     Risk Assessment/Calculations:         Physical Exam:    VS:  BP 118/70 (BP Location: Left Arm, Patient Position: Sitting, Cuff Size: Normal)   Pulse 63   Ht '5\' 7"'$  (1.702 m)   Wt 178 lb (80.7 kg)   SpO2 96%   BMI 27.88 kg/m     Wt Readings from Last 3 Encounters:  08/15/21 178 lb (80.7 kg)  06/27/21 179 lb (81.2 kg)  05/21/21 178 lb 6.4 oz (80.9 kg)  GEN:  Well nourished, well developed in no acute distress HEENT: Normal NECK: No JVD; No carotid bruits LYMPHATICS: No lymphadenopathy CARDIAC: RRR, no murmurs, rubs, gallops RESPIRATORY:  Clear to auscultation without rales, wheezing or rhonchi  ABDOMEN: Soft, non-tender, non-distended MUSCULOSKELETAL:  No edema; No deformity  SKIN: Warm and dry NEUROLOGIC:  Alert and oriented x 3 PSYCHIATRIC:  Normal affect   ASSESSMENT:    1. Hx of mitral valve prolapse   2. Pure hypercholesterolemia     PLAN:    In order of problems listed above:  Mild posterior mitral valve prolapse, mild to moderate MR noted on echo 07/2021.  EF 55 to 60%.  Continue monitoring with serial echocardiograms. Hyperlipidemia, continue statin.  Follow-up in about 18 months after repeat echo.    Medication Adjustments/Labs and Tests Ordered: Current medicines are reviewed at  length with the patient today.  Concerns regarding medicines are outlined above.  Orders Placed This Encounter  Procedures   ECHOCARDIOGRAM COMPLETE    No orders of the defined types were placed in this encounter.   Patient Instructions  Medication Instructions:  Your physician recommends that you continue on your current medications as directed. Please refer to the Current Medication list given to you today.  *If you need a refill on your cardiac medications before your next appointment, please call your pharmacy*   Lab Work: None ordered If you have labs (blood work) drawn today and your tests are completely normal, you will receive your results only by: [place] (if you have MyChart) OR A paper copy in the mail If you have any lab test that is abnormal or we need to change your treatment, we will call you to review the results.   Testing/Procedures:  Your physician has requested that you have an echocardiogram in 18 months. Echocardiography is a painless test that uses sound waves to create images of your heart. It provides your doctor with information about the size and shape of your heart and how well your heart's chambers and valves are working. This procedure takes approximately one hour. There are no restrictions for this procedure.    Follow-Up: At [facility], you and your health needs are our priority.  As part of our continuing mission to provide you with exceptional heart care, we have created designated Provider Care Teams.  These Care Teams include your primary Cardiologist (physician) and Advanced Practice Providers (APPs -  Physician Assistants and Nurse Practitioners) who all work together to provide you with the care you need, when you need it.  We recommend signing up for the patient portal called "MyChart".  Sign up information is provided on this After Visit Summary.  MyChart is used to connect with patients for Virtual Visits (Telemedicine).  Patients  are able to view lab/test results, encounter notes, upcoming appointments, etc.  Non-urgent messages can be sent to your provider as well.   To learn more about what you can do with MyChart, go to [email or web address].    Your next appointment:   Follow up in 18 months   The format for your next appointment:   In Person  Provider:   Kate Sable, MD   Other Instructions     Signed, Kate Sable, MD  08/15/2021 12:21 PM    [place]

## 2021-08-15 NOTE — Patient Instructions (Signed)
Medication Instructions:  Your physician recommends that you continue on your current medications as directed. Please refer to the Current Medication list given to you today.  *If you need a refill on your cardiac medications before your next appointment, please call your pharmacy*   Lab Work: None ordered If you have labs (blood work) drawn today and your tests are completely normal, you will receive your results only by: [place] (if you have MyChart) OR A paper copy in the mail If you have any lab test that is abnormal or we need to change your treatment, we will call you to review the results.   Testing/Procedures:  Your physician has requested that you have an echocardiogram in 18 months. Echocardiography is a painless test that uses sound waves to create images of your heart. It provides your doctor with information about the size and shape of your heart and how well your heart's chambers and valves are working. This procedure takes approximately one hour. There are no restrictions for this procedure.    Follow-Up: At [facility], you and your health needs are our priority.  As part of our continuing mission to provide you with exceptional heart care, we have created designated Provider Care Teams.  These Care Teams include your primary Cardiologist (physician) and Advanced Practice Providers (APPs -  Physician Assistants and Nurse Practitioners) who all work together to provide you with the care you need, when you need it.  We recommend signing up for the patient portal called "MyChart".  Sign up information is provided on this After Visit Summary.  MyChart is used to connect with patients for Virtual Visits (Telemedicine).  Patients are able to view lab/test results, encounter notes, upcoming appointments, etc.  Non-urgent messages can be sent to your provider as well.   To learn more about what you can do with MyChart, go to [email or web address].    Your next  appointment:   Follow up in 18 months   The format for your next appointment:   In Person  Provider:   Kate Sable, MD   Other Instructions

## 2021-08-15 NOTE — Telephone Encounter (Signed)
Tablets d/c and capsules ordered.

## 2021-08-27 ENCOUNTER — Ambulatory Visit
Admission: RE | Admit: 2021-08-27 | Discharge: 2021-08-27 | Disposition: A | Discharge status: Home / Self Care | Payer: Medicare Other | Source: Ambulatory Visit | Attending: Adult Health | Admitting: Adult Health

## 2021-08-27 ENCOUNTER — Other Ambulatory Visit: Payer: Self-pay

## 2021-08-27 DIAGNOSIS — M8588 Other specified disorders of bone density and structure, other site: Secondary | ICD-10-CM | POA: Diagnosis not present

## 2021-08-27 DIAGNOSIS — Z1231 Encounter for screening mammogram for malignant neoplasm of breast: Secondary | ICD-10-CM | POA: Insufficient documentation

## 2021-08-27 DIAGNOSIS — Z780 Asymptomatic menopausal state: Secondary | ICD-10-CM | POA: Insufficient documentation

## 2021-08-27 DIAGNOSIS — Z13820 Encounter for screening for osteoporosis: Secondary | ICD-10-CM | POA: Insufficient documentation

## 2021-08-27 DIAGNOSIS — M8580 Other specified disorders of bone density and structure, unspecified site: Secondary | ICD-10-CM | POA: Insufficient documentation

## 2021-08-30 ENCOUNTER — Ambulatory Visit: Payer: Medicare Other

## 2021-09-03 ENCOUNTER — Ambulatory Visit (INDEPENDENT_AMBULATORY_CARE_PROVIDER_SITE_OTHER): Payer: Medicare Other

## 2021-09-03 ENCOUNTER — Other Ambulatory Visit: Payer: Self-pay

## 2021-09-03 VITALS — Ht 67.0 in | Wt 178.0 lb

## 2021-09-03 DIAGNOSIS — Z0000 Encounter for general adult medical examination without abnormal findings: Secondary | ICD-10-CM

## 2021-09-03 DIAGNOSIS — Z23 Encounter for immunization: Secondary | ICD-10-CM | POA: Diagnosis not present

## 2021-09-03 NOTE — Progress Notes (Signed)
Subjective:   Misty Walker is a 65 y.o. female who presents for an Initial Medicare Annual Wellness Visit.  Review of Systems    No ROS.  Medicare Wellness Virtual Visit.  Visual/audio telehealth visit, UTA vital signs.   See social history for additional risk factors.   Cardiac Risk Factors include: advanced age (>52mn, >>81women)     Objective:    Today's Vitals   09/03/21 0956  Weight: 178 lb (80.7 kg)  Height: '5\' 7"'$  (1.702 m)   Body mass index is 27.88 kg/m.  Advanced Directives 09/03/2021  Does Patient Have a Medical Advance Directive? Yes  Type of AParamedicof AVillalbaLiving will  Does patient want to make changes to medical advance directive? No - Patient declined  Copy of HWhitehallin Chart? No - copy requested    Current Medications (verified) Outpatient Encounter Medications as of 09/03/2021  Medication Sig   atenolol (TENORMIN) 25 MG tablet Take 1 tablet (25 mg total) by mouth daily.   FLUoxetine (PROZAC) 10 MG capsule Take 1 capsule (10 mg total) by mouth daily.   simvastatin (ZOCOR) 5 MG tablet Take 1 tablet (5 mg total) by mouth daily.   No facility-administered encounter medications on file as of 09/03/2021.    Allergies (verified) Patient has no known allergies.   History: Past Medical History:  Diagnosis Date   Hyperlipidemia    History reviewed. No pertinent surgical history. Family History  Problem Relation Age of Onset   Hypertension Mother    Breast cancer Neg Hx    Social History   Socioeconomic History   Marital status: Married    Spouse name: Not on file   Number of children: Not on file   Years of education: Not on file   Highest education level: Not on file  Occupational History   Not on file  Tobacco Use   Smoking status: Never   Smokeless tobacco: Never  Substance and Sexual Activity   Alcohol use: Not on file   Drug use: Never   Sexual activity: Yes  Other Topics Concern    Not on file  Social History Narrative   Not on file   Social Determinants of Health   Financial Resource Strain: Low Risk    Difficulty of Paying Living Expenses: Not hard at all  Food Insecurity: No Food Insecurity   Worried About Running Out of Food in the Last Year: Never true   RWalnutin the Last Year: Never true  Transportation Needs: No Transportation Needs   Lack of Transportation (Medical): No   Lack of Transportation (Non-Medical): No  Physical Activity: Sufficiently Active   Days of Exercise per Week: 5 days   Minutes of Exercise per Session: 30 min  Stress: No Stress Concern Present   Feeling of Stress : Not at all  Social Connections: Unknown   Frequency of Communication with Friends and Family: Not on file   Frequency of Social Gatherings with Friends and Family: Not on file   Attends Religious Services: Not on fElectrical engineeror Organizations: Not on file   Attends CArchivistMeetings: Not on file   Marital Status: Married    Tobacco Counseling Counseling given: Not Answered   Clinical Intake:  Pre-visit preparation completed: Yes        Diabetes: No  How often do you need to have someone help you when you read instructions, pamphlets, or  other written materials from your doctor or pharmacy?: 1 - Never  Interpreter Needed?: No      Activities of Daily Living In your present state of health, do you have any difficulty performing the following activities: 09/03/2021  Hearing? N  Vision? N  Difficulty concentrating or making decisions? N  Walking or climbing stairs? N  Dressing or bathing? N  Doing errands, shopping? N  Preparing Food and eating ? N  Using the Toilet? N  In the past six months, have you accidently leaked urine? Y  Comment Managed with daily pad  Do you have problems with loss of bowel control? N  Managing your Medications? N  Managing your Finances? N  Housekeeping or managing your  Housekeeping? N    Patient Care Team: Flinchum, Kelby Aline, FNP as PCP - General (Family Medicine) Kate Sable, MD as PCP - Cardiology (Cardiology)  Indicate any recent Medical Services you may have received from other than [facility] providers in the past year (date may be approximate).     Assessment:   This is a routine wellness examination for [facility].  I connected with Misty Walker today by telephone and verified that I am speaking with the correct person using two identifiers. Location patient: home Location provider: work Persons participating in the virtual visit: patient, Marine scientist.    I discussed the limitations, risks, security and privacy concerns of performing an evaluation and management service by telephone and the availability of in person appointments. The patient expressed understanding and verbally consented to this telephonic visit.    Interactive audio and video telecommunications were attempted between this provider and patient, however failed, due to patient having technical difficulties OR patient did not have access to video capability.  We continued and completed visit with audio only.  Some vital signs may be absent or patient reported.   Hearing/Vision screen Hearing Screening - Comments:: Patient is able to hear conversational tones without difficulty.  No issues reported. Vision Screening - Comments:: Wears corrective lenses They have seen their ophthalmologist in the last 12-24 months.    Dietary issues and exercise activities discussed: Current Exercise Habits: Home exercise routine, Type of exercise: walking, Intensity: Mild   Goals Addressed               This Visit's Progress     Patient Stated     Weight (lb) < 178 lb (80.7 kg) (pt-stated)   178 lb (80.7 kg)     I would like to lose a little weight       Depression Screen PHQ 2/9 Scores 09/03/2021 05/21/2021  PHQ - 2 Score 0 3  PHQ- 9 Score - 5    Fall Risk Fall Risk  09/03/2021  Falls in the  past year? 0  Injury with Fall? 0  Follow up Falls evaluation completed    [place]: Adequate lighting in your home to reduce risk of falls? Yes   ASSISTIVE DEVICES UTILIZED TO PREVENT FALLS: Use of a cane, walker or w/c? No   TIMED UP AND GO: Was the test performed? No .   Cognitive Function:  Patient is alert and oriented x3.  Enjoys brain health games/activities.  Denies difficulty focusing, concentrating, making decisions, memory loss.  MMSE/6CIT deferred. Normal by direct communication/observation.       Immunizations Immunization History  Administered Date(s) Administered   [place] Vaccination 11/30/2020   Moderna Sars-Covid-2 Vaccination 02/11/2020, 03/10/2020    TDAP status: Due, Education  has been provided regarding the importance of this vaccine. Advised may receive this vaccine at local pharmacy or Health Dept. Aware to provide a copy of the vaccination record if obtained from local pharmacy or Health Dept. Verbalized acceptance and understanding. Deferred.   Influenza vaccine- deferred.   PNA vaccine- deferred.   Shingles vaccine- Due, Education has been provided regarding the importance of this vaccine. Advised may receive this vaccine at local pharmacy or Health Dept. Aware to provide a copy of the vaccination record if obtained from local pharmacy or Health Dept. Verbalized acceptance and understanding. Deferred.   Awaiting health maintenance records to be abstracted.   Health Maintenance Health Maintenance  Topic Date Due   MAMMOGRAM  Never done   COVID-19 Vaccine (4 - Booster for Moderna series) 09/19/2021 (Originally 02/22/2021)   Hepatitis C Screening  11/21/2021 (Originally [birth year]/30/1972)   TETANUS/TDAP  11/22/2021 (Originally 03/30/1973)   Zoster Vaccines- Shingrix (1 of 2) 12/03/2021 (Originally 03/30/1973)   INFLUENZA VACCINE  03/21/2022 (Originally 07/22/2021)   PNA vac Low Risk Adult (1 of 2 - PCV13)  09/03/2022 (Originally 03/31/2019)   COLONOSCOPY (Pts 45-71yr Insurance coverage will need to be confirmed)  12/22/2022   DEXA SCAN  Completed   HPV VACCINES  Aged Out   Lung Cancer Screening: (Low Dose CT Chest recommended if Age 67-80years, 30 pack-year currently smoking OR have quit w/in 15years.) does not qualify.   Vision Screening: Recommended annual ophthalmology exams for early detection of glaucoma and other disorders of the eye.  Dental Screening: Recommended annual dental exams for proper oral hygiene  Community Resource Referral / Chronic Care Management: CRR required this visit?  No   CCM required this visit?  No      Plan:   Keep all routine maintenance appointments.   I have personally reviewed and noted the following in the patient's chart:   Medical and social history Use of alcohol, tobacco or illicit drugs  Current medications and supplements including opioid prescriptions. Patient is not currently taking opioid prescriptions. Functional ability and status Nutritional status Physical activity Advanced directives List of other physicians Hospitalizations, surgeries, and ER visits in previous 12 months Vitals Screenings to include cognitive, depression, and falls Referrals and appointments  In addition, I have reviewed and discussed with patient certain preventive protocols, quality metrics, and best practice recommendations. A written personalized care plan for preventive services as well as general preventive health recommendations were provided to patient via mychart.     OVarney Biles LPN   [phone]

## 2021-09-03 NOTE — Patient Instructions (Addendum)
  Misty Walker , Thank you for taking time to come for your Medicare Wellness Visit. I appreciate your ongoing commitment to your health goals. Please review the following plan we discussed and let me know if I can assist you in the future.   These are the goals we discussed:  Goals       Patient Stated     Weight (lb) < 178 lb (80.7 kg) (pt-stated)      I would like to lose a little weight        This is a list of the screening recommended for you and due dates:  Health Maintenance  Topic Date Due   Mammogram  Never done   COVID-19 Vaccine (4 - Booster for Moderna series) 09/19/2021*   Hepatitis C Screening: USPSTF Recommendation to screen - Ages 18-79 yo.  11/21/2021*   Tetanus Vaccine  11/22/2021*   Zoster (Shingles) Vaccine (1 of 2) 12/03/2021*   Flu Shot  03/21/2022*   Pneumonia vaccines (1 of 2 - PCV13) 09/03/2022*   Colon Cancer Screening  12/22/2022   DEXA scan (bone density measurement)  Completed   HPV Vaccine  Aged Out  *Topic was postponed. The date shown is not the original due date.

## 2021-09-23 ENCOUNTER — Ambulatory Visit: Payer: Medicare Other | Admitting: Adult Health

## 2021-10-08 ENCOUNTER — Encounter: Payer: Self-pay | Admitting: Family

## 2021-10-08 ENCOUNTER — Other Ambulatory Visit: Payer: Self-pay

## 2021-10-08 ENCOUNTER — Ambulatory Visit (INDEPENDENT_AMBULATORY_CARE_PROVIDER_SITE_OTHER): Payer: Medicare Other | Admitting: Family

## 2021-10-08 DIAGNOSIS — E785 Hyperlipidemia, unspecified: Secondary | ICD-10-CM | POA: Diagnosis not present

## 2021-10-08 DIAGNOSIS — F339 Major depressive disorder, recurrent, unspecified: Secondary | ICD-10-CM

## 2021-10-08 DIAGNOSIS — M8580 Other specified disorders of bone density and structure, unspecified site: Secondary | ICD-10-CM | POA: Insufficient documentation

## 2021-10-08 DIAGNOSIS — I10 Essential (primary) hypertension: Secondary | ICD-10-CM | POA: Diagnosis not present

## 2021-10-08 DIAGNOSIS — I341 Nonrheumatic mitral (valve) prolapse: Secondary | ICD-10-CM

## 2021-10-08 NOTE — Assessment & Plan Note (Signed)
Chronic, stable.  Asymptomatic.  She will continue following with cardiology, Dr Charlestine Night for serial surveillance

## 2021-10-08 NOTE — Progress Notes (Signed)
Subjective:    Patient ID: Misty Walker, female    DOB: Apr 23, 1956, 65 y.o.   MRN: [id number]  CC: Misty Walker is a 65 y.o. female who presents today for follow up.   HPI: Feels well today.  No new complaints.    HTN- compliant with atenolol No cp, sob, palpitations  She is compliant with Prozac.  She feels well on this dose.  This dose was started approximately 12 years ago during perimenopause.  no depression, anxiety at this time.  She is sleeping well.  No SI/HI  Hyperlipidemia-compliant with Zocor 5 mg  History of mitral valve prolapse.  Recent follow-up with Dr. Charlestine Night 08/15/2021.  Plan to continue monitoring with serial echocardiograms  History of osteopenia-she has question about how much calcium to take as she has read that it causes constipation  HISTORY:  Past Medical History:  Diagnosis Date   Hyperlipidemia    No past surgical history on file. Family History  Problem Relation Age of Onset   Hypertension Mother    Breast cancer Neg Hx     Allergies: Patient has no known allergies. Current Outpatient Medications on File Prior to Visit  Medication Sig Dispense Refill   atenolol (TENORMIN) 25 MG tablet Take 1 tablet (25 mg total) by mouth daily. 90 tablet 1   FLUoxetine (PROZAC) 10 MG capsule Take 1 capsule (10 mg total) by mouth daily. 90 capsule 1   simvastatin (ZOCOR) 5 MG tablet Take 1 tablet (5 mg total) by mouth daily. 90 tablet 1   No current facility-administered medications on file prior to visit.    Social History   Tobacco Use   Smoking status: Never   Smokeless tobacco: Never  Substance Use Topics   Drug use: Never    Review of Systems  Constitutional:  Negative for chills and fever.  Respiratory:  Negative for cough.   Cardiovascular:  Negative for chest pain and palpitations.  Gastrointestinal:  Negative for nausea and vomiting.     Objective:    BP 128/82 (BP Location: Right Arm, Patient Position: Sitting, Cuff Size: Normal)   Pulse 69    Temp 98.6 F (37 C) (Oral)   Ht 5\' 7"  (1.702 m)   Wt 178 lb 3.2 oz (80.8 kg)   SpO2 97%   BMI 27.91 kg/m  BP Readings from Last 3 Encounters:  10/08/21 128/82  08/15/21 118/70  06/27/21 114/74   Wt Readings from Last 3 Encounters:  10/08/21 178 lb 3.2 oz (80.8 kg)  09/03/21 178 lb (80.7 kg)  08/15/21 178 lb (80.7 kg)    Physical Exam Vitals reviewed.  Constitutional:      Appearance: She is well-developed.  Eyes:     Conjunctiva/sclera: Conjunctivae normal.  Cardiovascular:     Rate and Rhythm: Normal rate and regular rhythm.     Pulses: Normal pulses.     Heart sounds: Normal heart sounds.  Pulmonary:     Effort: Pulmonary effort is normal.     Breath sounds: Normal breath sounds. No wheezing, rhonchi or rales.  Skin:    General: Skin is warm and dry.  Neurological:     Mental Status: She is alert.  Psychiatric:        Speech: Speech normal.        Behavior: Behavior normal.        Thought Content: Thought content normal.       Assessment & Plan:   Problem List Items Addressed This Visit  Cardiovascular and Mediastinum   HTN (hypertension)    Stable, controlled.  Continue atenolol 25 mg daily      Mitral valve prolapse    Chronic, stable.  Asymptomatic.  She will continue following with cardiology, Dr Charlestine Night for serial surveillance        Musculoskeletal and Integument   Osteopenia    Recent DEXA scan 08/2021.  Counseled on appropriate milligrams of calcium, and vitamin D.  She will repeat bone density in another 1 to 2 years        Other   Depression, recurrent (HCC)    Chronic, stable.  Continue Prozac 10 mg      Hyperlipidemia    Chronic, stable.  LDL less than 100.  Continue Zocor 5 mg        I am having Misty Walker maintain her atenolol, simvastatin, and FLUoxetine.   No orders of the defined types were placed in this encounter.   Return precautions given.   Risks, benefits, and alternatives of the medications and treatment  plan prescribed today were discussed, and patient expressed understanding.   Education regarding symptom management and diagnosis given to patient on AVS.  Continue to follow with Flinchum, Kelby Aline, FNP for routine health maintenance.   Misty Walker and I agreed with plan.   Mable Paris, FNP

## 2021-10-08 NOTE — Assessment & Plan Note (Signed)
Chronic, stable.  Continue Prozac 10 mg

## 2021-10-08 NOTE — Assessment & Plan Note (Signed)
Stable, controlled.  Continue atenolol 25 mg daily

## 2021-10-08 NOTE — Assessment & Plan Note (Signed)
Chronic, stable.  LDL less than 100.  Continue Zocor 5 mg

## 2021-10-08 NOTE — Patient Instructions (Addendum)
Nice to meet you!  For post menopausal women, guidelines recommend a diet with 1200 mg of Calcium per day. If you are eating calcium rich foods, you do not need a calcium supplement. The body better absorbs the calcium that you eat over supplementation. If you do supplement, I recommend not supplementing the full 1200 mg/ day as this can lead to increased risk of cardiovascular disease. I recommend Calcium Citrate over the counter, and you may take a total of 600 to 800 mg per day in divided doses with meals for best absorption.   For bone health, you need adequate vitamin D, and I recommend you supplement as it is harder to do so with diet alone. I recommend cholecalciferol 800 units daily.  Also, please ensure you are following a diet high in calcium -- research shows better outcomes with dietary sources including kale, yogurt, broccolii, cheese, okra, almonds- to name a few.     Also remember that exercise is a great medicine for maintain and preserve bone health. Advise moderate exercise for 30 minutes , 3 times per week.

## 2021-10-08 NOTE — Assessment & Plan Note (Signed)
Recent DEXA scan 08/2021.  Counseled on appropriate milligrams of calcium, and vitamin D.  She will repeat bone density in another 1 to 2 years

## 2022-01-16 ENCOUNTER — Other Ambulatory Visit: Payer: Self-pay | Admitting: Adult Health

## 2022-01-24 ENCOUNTER — Telehealth: Payer: Self-pay | Admitting: Adult Health

## 2022-01-24 MED ORDER — ATENOLOL 25 MG PO TABS: 25.0000 mg | ORAL_TABLET | Freq: Every day | ORAL | 1 refills | Status: DC

## 2022-01-24 MED ORDER — FLUOXETINE HCL 10 MG PO CAPS: 10.0000 mg | ORAL_CAPSULE | Freq: Every day | ORAL | 1 refills | Status: DC

## 2022-01-24 MED ORDER — SIMVASTATIN 5 MG PO TABS: 5.0000 mg | ORAL_TABLET | Freq: Every day | ORAL | 1 refills | Status: DC

## 2022-01-24 NOTE — Addendum Note (Signed)
Addended by: Nanci Pina on: 01/24/2022 02:30 PM   Modules accepted: Orders

## 2022-01-24 NOTE — Telephone Encounter (Signed)
Refills sent as requested

## 2022-01-24 NOTE — Telephone Encounter (Signed)
Patient is requesting refill for all three of her medicaitons. Patient uses [facility] mail service home delivery.  Patient is needing:  atenolol (TENORMIN) 25 MG tablet FLUoxetine (PROZAC) 10 MG capsule simvastatin (ZOCOR) 5 MG tablet

## 2022-01-29 NOTE — Telephone Encounter (Signed)
Pt called stating [facility] rx is sayig medication is denied when pt goes into her account. Pt does not know what is going on.

## 2022-03-20 ENCOUNTER — Ambulatory Visit (INDEPENDENT_AMBULATORY_CARE_PROVIDER_SITE_OTHER): Payer: Medicare Other | Admitting: Family

## 2022-03-20 ENCOUNTER — Encounter: Payer: Self-pay | Admitting: Family

## 2022-03-20 DIAGNOSIS — E785 Hyperlipidemia, unspecified: Secondary | ICD-10-CM | POA: Diagnosis not present

## 2022-03-20 DIAGNOSIS — M8580 Other specified disorders of bone density and structure, unspecified site: Secondary | ICD-10-CM

## 2022-03-20 DIAGNOSIS — F339 Major depressive disorder, recurrent, unspecified: Secondary | ICD-10-CM | POA: Diagnosis not present

## 2022-03-20 DIAGNOSIS — I341 Nonrheumatic mitral (valve) prolapse: Secondary | ICD-10-CM

## 2022-03-20 NOTE — Assessment & Plan Note (Signed)
D/w pt can increase to prozac 20 mg , however, pt states for now will continue with prozac 10 mg once daily.  ?D/w pt possible referral for therapist. Work on anxiety reducing techniques. Deep breathing techniques.  ?

## 2022-03-20 NOTE — Assessment & Plan Note (Signed)
Continue simvastatin 5 mg  ?Work on low cholesterol diet and exercise as tolerated ? ?

## 2022-03-20 NOTE — Assessment & Plan Note (Signed)
Continue atenolol 25 mg once daily  ?Cont f/u with cardiologist  ?

## 2022-03-20 NOTE — Progress Notes (Signed)
? ?Established Patient Office Visit ? ?Subjective:  ?Patient ID: Misty Walker, female    DOB: 20-Nov-1956  Age: 66 y.o. MRN: [id number] ? ?CC:  ?Chief Complaint  ?Patient presents with  ? Establish Care  ? ? ?HPI ?[name] is here for a transition of care visit. ? ?Prior provider was: Laverna Peace, FNP  ?Pt is without acute concerns.  ? ?chronic concerns: ? ?Anxiety/depression: has never seen a therapist but thinks she should consider this. She is stressed, husband goes into surgery on Thursday. Moved here from [place] to be closer to her daughter and she has not been very welcoming which has been hard on her. Feels like she has pretty good coping skills. She denies need for increase to prozac at this time however she may consider in the future.  ? Pt refuses phq9 and gad 7 states too much for her to talk about right now. ? ?Hyperlipidemia: simvastatin, denies myalgias tolerating well. Does exercise 2.5 miles a day. Works on a low cholesterol diet.  ? ?MVP: sees cardiologist every six months, doing well on atenolol had palpitations/dizziness back in her 31 's which have been stable since. Does drink caffeine, two cups of coffee a day.  ? ?Osteopenia: 08/27/21, AP spine -1.7. does take daily vitamin d and calcium. Working on weight bearing exercises.  ? ?Mammogram: negative findings, 08/27/21 ?Pap: every five years, >65 ? ?Past Medical History:  ?Diagnosis Date  ? Hyperlipidemia   ? ? ?History reviewed. No pertinent surgical history. ? ?Family History  ?Problem Relation Age of Onset  ? Hypertension Mother   ? Breast cancer Neg Hx   ? ? ?Social History  ? ?Socioeconomic History  ? Marital status: Married  ?  Spouse name: Not on file  ? Number of children: 1  ? Years of education: Not on file  ? Highest education level: Not on file  ?Occupational History  ? Occupation: retired  ?  Comment: elementary Restaurant manager, fast food  ?Tobacco Use  ? Smoking status: Never  ? Smokeless tobacco: Never  ?Vaping Use  ? Vaping Use: Never  used  ?Substance and Sexual Activity  ? Alcohol use: Yes  ?  Alcohol/week: 3.0 standard drinks  ?  Types: 3 Glasses of wine per week  ?  Comment: glass of wine three times week  ? Drug use: Never  ? Sexual activity: Yes  ?  Partners: Male  ?  Birth control/protection: Post-menopausal  ?Other Topics Concern  ? Not on file  ?Social History Narrative  ? Not on file  ? ?Social Determinants of Health  ? ?Financial Resource Strain: Low Risk   ? Difficulty of Paying Living Expenses: Not hard at all  ?Food Insecurity: No Food Insecurity  ? Worried About Charity fundraiser in the Last Year: Never true  ? Ran Out of Food in the Last Year: Never true  ?Transportation Needs: No Transportation Needs  ? Lack of Transportation (Medical): No  ? Lack of Transportation (Non-Medical): No  ?Physical Activity: Sufficiently Active  ? Days of Exercise per Week: 5 days  ? Minutes of Exercise per Session: 30 min  ?Stress: No Stress Concern Present  ? Feeling of Stress : Not at all  ?Social Connections: Unknown  ? Frequency of Communication with Friends and Family: Not on file  ? Frequency of Social Gatherings with Friends and Family: Not on file  ? Attends Religious Services: Not on file  ? Active Member of Clubs or Organizations: Not on file  ?  Attends Archivist Meetings: Not on file  ? Marital Status: Married  ?Intimate Partner Violence: Not At Risk  ? Fear of Current or Ex-Partner: No  ? Emotionally Abused: No  ? Physically Abused: No  ? Sexually Abused: No  ? ? ?Outpatient Medications Prior to Visit  ?Medication Sig Dispense Refill  ? atenolol (TENORMIN) 25 MG tablet Take 1 tablet (25 mg total) by mouth daily. 90 tablet 1  ? CALCIUM-VITAMIN D PO Take by mouth.    ? FLUoxetine (PROZAC) 10 MG capsule Take 1 capsule (10 mg total) by mouth daily. 90 capsule 1  ? Krill Oil 1000 MG CAPS Take by mouth.    ? Multiple Vitamin (MULTIVITAMIN ADULT PO) Take by mouth.    ? simvastatin (ZOCOR) 5 MG tablet Take 1 tablet (5 mg total) by  mouth daily. 90 tablet 1  ? tretinoin (RETIN-A) 0.1 % cream SMARTSIG:Sparingly Topical Every Night    ? ?No facility-administered medications prior to visit.  ? ? ?No Known Allergies ? ?ROS ?Review of Systems  ?Constitutional:  Negative for chills, fatigue, fever and unexpected weight change.  ?Eyes:  Negative for visual disturbance.  ?Respiratory:  Negative for shortness of breath.   ?Cardiovascular:  Negative for chest pain and palpitations.  ?Gastrointestinal:  Negative for abdominal pain.  ?Genitourinary:  Negative for difficulty urinating.  ?Musculoskeletal:  Negative for myalgias.  ?Skin:  Negative for rash.  ?Neurological:  Negative for dizziness and headaches.  ?Psychiatric/Behavioral:  Positive for behavioral problems (feeling sad and more emotinal lately, alot of life events going on right now). Negative for self-injury, sleep disturbance and suicidal ideas. The patient is nervous/anxious.   ? ? ? ?  ?Objective:  ?  ?Physical Exam ? ?Gen: NAD, resting comfortably ?CV: RRR with no murmurs appreciated ?Pulm: NWOB, CTAB with no crackles, wheezes, or rhonchi ?Skin: warm, dry ?Psych: Normal affect and thought content ? ?BP 132/78   Pulse 66   Temp 98.7 ?F (37.1 ?C)   Resp 16   Ht '5\' 7"'$  (1.702 m)   Wt 179 lb 8 oz (81.4 kg)   SpO2 97%   BMI 28.11 kg/m?  ?Wt Readings from Last 3 Encounters:  ?03/20/22 179 lb 8 oz (81.4 kg)  ?10/08/21 178 lb 3.2 oz (80.8 kg)  ?09/03/21 178 lb (80.7 kg)  ? ? ? ?Health Maintenance Due  ?Topic Date Due  ? Zoster Vaccines- Shingrix (1 of 2) Never done  ? ? ?There are no preventive care reminders to display for this patient. ? ?Lab Results  ?Component Value Date  ? TSH 2.00 05/31/2021  ? ?Lab Results  ?Component Value Date  ? WBC 8.5 05/31/2021  ? HGB 14.2 05/31/2021  ? HCT 41.8 05/31/2021  ? MCV 93.6 05/31/2021  ? PLT 300.0 05/31/2021  ? ?Lab Results  ?Component Value Date  ? NA 136 05/31/2021  ? K 3.9 05/31/2021  ? CO2 26 05/31/2021  ? GLUCOSE 90 05/31/2021  ? BUN 14 05/31/2021   ? CREATININE 0.77 05/31/2021  ? BILITOT 0.6 05/31/2021  ? ALKPHOS 78 05/31/2021  ? AST 20 05/31/2021  ? ALT 22 05/31/2021  ? PROT 6.6 05/31/2021  ? ALBUMIN 4.3 05/31/2021  ? CALCIUM 8.9 05/31/2021  ? GFR 79.96 05/31/2021  ? ?Lab Results  ?Component Value Date  ? CHOL 160 05/31/2021  ? ?Lab Results  ?Component Value Date  ? HDL 57 05/31/2021  ? ?Lab Results  ?Component Value Date  ? [place] 88 05/31/2021  ? ?Lab Results  ?  Component Value Date  ? TRIG 76 05/31/2021  ? ?No results found for: CHOLHDL ?No results found for: HGBA1C ? ?  ?Assessment & Plan:  ? ?Problem List Items Addressed This Visit   ? ?  ? Cardiovascular and Mediastinum  ? Mitral valve prolapse  ?  Continue atenolol 25 mg once daily  ?Cont f/u with cardiologist  ?  ?  ?  ? Musculoskeletal and Integument  ? Osteopenia  ?  Continue vitamin d and calcium  ?Continue weight bearing exercises  ?dexa scan due 2024 ?  ?  ?  ? Other  ? Depression, recurrent ([place])  ?  D/w pt can increase to prozac 20 mg , however, pt states for now will continue with prozac 10 mg once daily.  ?D/w pt possible referral for therapist. Work on anxiety reducing techniques. Deep breathing techniques.  ?  ?  ? Hyperlipidemia  ?  Continue simvastatin 5 mg  ?Work on low cholesterol diet and exercise as tolerated ? ?  ?  ? ? ?No orders of the defined types were placed in this encounter. ? ? ?Follow-up: Return in about 3 months (around 06/20/2022) for physical exam, medlcare, will include fasting labs.  ? ? ?Eugenia Pancoast, FNP ?

## 2022-03-20 NOTE — Assessment & Plan Note (Signed)
Continue vitamin d and calcium  ?Continue weight bearing exercises  ?dexa scan due 2024 ?

## 2022-03-20 NOTE — Patient Instructions (Addendum)
Recommend shingles vaccination aka Shingrix.  ? ?It was a pleasure seeing you today! Please do not hesitate to reach out with any questions and or concerns. ? ?Regards,  ? ?Yared Susan ?FNP-C ? ? ? ? ?

## 2022-05-05 ENCOUNTER — Encounter: Payer: Self-pay | Admitting: Family

## 2022-05-22 ENCOUNTER — Encounter: Payer: Self-pay | Admitting: Family

## 2022-05-22 DIAGNOSIS — F320 Major depressive disorder, single episode, mild: Secondary | ICD-10-CM

## 2022-05-22 MED ORDER — FLUOXETINE HCL 20 MG PO TABS
20.0000 mg | ORAL_TABLET | Freq: Every day | ORAL | 0 refills | Status: DC
Start: 2022-05-22 — End: 2022-05-30

## 2022-05-23 ENCOUNTER — Other Ambulatory Visit: Payer: Self-pay | Admitting: Family

## 2022-05-23 DIAGNOSIS — E785 Hyperlipidemia, unspecified: Secondary | ICD-10-CM

## 2022-05-23 MED ORDER — ROSUVASTATIN CALCIUM 5 MG PO TABS: 5.0000 mg | ORAL_TABLET | Freq: Every day | ORAL | 3 refills | Status: DC

## 2022-05-29 ENCOUNTER — Encounter: Payer: Self-pay | Admitting: Family

## 2022-05-30 ENCOUNTER — Other Ambulatory Visit: Payer: Self-pay | Admitting: Family

## 2022-05-30 DIAGNOSIS — F339 Major depressive disorder, recurrent, unspecified: Secondary | ICD-10-CM

## 2022-05-30 MED ORDER — FLUOXETINE HCL 20 MG PO CAPS: 20.0000 mg | ORAL_CAPSULE | Freq: Every day | ORAL | 3 refills | Status: DC

## 2022-06-03 ENCOUNTER — Telehealth: Payer: Self-pay

## 2022-06-03 NOTE — Telephone Encounter (Signed)
Thank you :)

## 2022-06-03 NOTE — Telephone Encounter (Signed)
Prior auth started for FLUoxetine HCl '20MG'$  capsules. [facility] [place] [place]: [id number] - [place] Case ID: [id number] Received determination:   This medication or product is on your plan's list of covered drugs. Prior authorization is not required at this time. If your pharmacy has questions regarding the processing of your prescription, please have them call the [facility] pharmacy help desk at [phone].

## 2022-06-16 DIAGNOSIS — H35372 Puckering of macula, left eye: Secondary | ICD-10-CM | POA: Diagnosis not present

## 2022-06-16 DIAGNOSIS — H52213 Irregular astigmatism, bilateral: Secondary | ICD-10-CM | POA: Diagnosis not present

## 2022-06-16 DIAGNOSIS — H0288B Meibomian gland dysfunction left eye, upper and lower eyelids: Secondary | ICD-10-CM | POA: Diagnosis not present

## 2022-06-16 DIAGNOSIS — H0288A Meibomian gland dysfunction right eye, upper and lower eyelids: Secondary | ICD-10-CM | POA: Diagnosis not present

## 2022-06-16 DIAGNOSIS — H524 Presbyopia: Secondary | ICD-10-CM | POA: Diagnosis not present

## 2022-06-16 DIAGNOSIS — H5203 Hypermetropia, bilateral: Secondary | ICD-10-CM | POA: Diagnosis not present

## 2022-07-04 ENCOUNTER — Encounter: Payer: Self-pay | Admitting: Family

## 2022-07-07 ENCOUNTER — Other Ambulatory Visit: Payer: Self-pay

## 2022-07-07 MED ORDER — ATENOLOL 25 MG PO TABS
25.0000 mg | ORAL_TABLET | Freq: Every day | ORAL | 1 refills | Status: DC
Start: 2022-07-07 — End: 2022-09-11

## 2022-08-04 ENCOUNTER — Ambulatory Visit (INDEPENDENT_AMBULATORY_CARE_PROVIDER_SITE_OTHER): Payer: Medicare Other | Admitting: Family

## 2022-08-04 ENCOUNTER — Encounter: Payer: Self-pay | Admitting: Family

## 2022-08-04 VITALS — BP 128/76 | HR 56 | Temp 98.6°F | Resp 16 | Ht 67.0 in | Wt 180.1 lb

## 2022-08-04 DIAGNOSIS — E559 Vitamin D deficiency, unspecified: Secondary | ICD-10-CM | POA: Diagnosis not present

## 2022-08-04 DIAGNOSIS — Z0000 Encounter for general adult medical examination without abnormal findings: Secondary | ICD-10-CM | POA: Diagnosis not present

## 2022-08-04 DIAGNOSIS — I341 Nonrheumatic mitral (valve) prolapse: Secondary | ICD-10-CM

## 2022-08-04 DIAGNOSIS — M8580 Other specified disorders of bone density and structure, unspecified site: Secondary | ICD-10-CM | POA: Diagnosis not present

## 2022-08-04 DIAGNOSIS — R7989 Other specified abnormal findings of blood chemistry: Secondary | ICD-10-CM

## 2022-08-04 DIAGNOSIS — Z1231 Encounter for screening mammogram for malignant neoplasm of breast: Secondary | ICD-10-CM | POA: Diagnosis not present

## 2022-08-04 DIAGNOSIS — F339 Major depressive disorder, recurrent, unspecified: Secondary | ICD-10-CM

## 2022-08-04 DIAGNOSIS — E785 Hyperlipidemia, unspecified: Secondary | ICD-10-CM

## 2022-08-04 DIAGNOSIS — R413 Other amnesia: Secondary | ICD-10-CM

## 2022-08-04 DIAGNOSIS — Z780 Asymptomatic menopausal state: Secondary | ICD-10-CM

## 2022-08-04 LAB — COMPREHENSIVE METABOLIC PANEL
ALT: 21 U/L (ref 0–35)
AST: 21 U/L (ref 0–37)
Albumin: 4.3 g/dL (ref 3.5–5.2)
Alkaline Phosphatase: 79 U/L (ref 39–117)
BUN: 15 mg/dL (ref 6–23)
CO2: 28 mEq/L (ref 19–32)
Calcium: 9.7 mg/dL (ref 8.4–10.5)
Chloride: 97 mEq/L (ref 96–112)
Creatinine, Ser: 0.8 mg/dL (ref 0.40–1.20)
GFR: 75.75 mL/min (ref >60.00)
Glucose, Bld: 82 mg/dL (ref 70–99)
Potassium: 4.3 mEq/L (ref 3.5–5.1)
Sodium: 135 mEq/L (ref 135–145)
Total Bilirubin: 0.5 mg/dL (ref 0.2–1.2)
Total Protein: 6.6 g/dL (ref 6.0–8.3)

## 2022-08-04 LAB — B12 AND FOLATE PANEL
Folate: 17.8 ng/mL (ref >5.9)
Vitamin B-12: 282 pg/mL (ref 211–911)

## 2022-08-04 LAB — VITAMIN D 25 HYDROXY (VIT D DEFICIENCY, FRACTURES): VITD: 46.69 ng/mL (ref 30.00–100.00)

## 2022-08-04 LAB — LIPID PANEL
Cholesterol: 151 mg/dL (ref 0–200)
HDL: 62.7 mg/dL (ref >39.00)
LDL Cholesterol: 73 mg/dL (ref 0–99)
NonHDL: 88.33
Total CHOL/HDL Ratio: 2
Triglycerides: 79 mg/dL (ref 0.0–149.0)
VLDL: 15.8 mg/dL (ref 0.0–40.0)

## 2022-08-04 LAB — CBC
HCT: 44 % (ref 36.0–46.0)
Hemoglobin: 14.3 g/dL (ref 12.0–15.0)
MCHC: 32.5 g/dL (ref 30.0–36.0)
MCV: 94.8 fl (ref 78.0–100.0)
Platelets: 302 10*3/uL (ref 150.0–400.0)
RBC: 4.64 Mil/uL (ref 3.87–5.11)
RDW: 12.2 % (ref 11.5–15.5)
WBC: 9.1 10*3/uL (ref 4.0–10.5)

## 2022-08-04 NOTE — Assessment & Plan Note (Signed)
b12 folate today Also increased anxiety state which may be contributing.

## 2022-08-04 NOTE — Progress Notes (Signed)
Established Patient Office Visit  Subjective:  Patient ID: Misty Walker, female    DOB: [birth year]  Age: 66 y.o. MRN: [id number]  CC:  Chief Complaint  Patient presents with   Annual Exam    HPI Kenna Seward is here today for an annual comprehensive exam.   Pt is without acute concerns.   Mammogram 08/27/21  Dexa: due 2024 Pap: over 11 y/o never abn last was 2020 negative Shingles vaccination: considering the shingrix has had zostavax   Low vitamin d - did bring supplements today with daily vitamin d in her supplement at 2000 IU as well as an extra supplement vitamin D3 5000 IU with K2 tha tshe takes three times weekly. Curious if this is too much.   Chronic problems addressed today:  HLD: doing ok on rosuvastatin 5 mg, doing well.  Working on low cholesterol diet, tries to eat healthy, walks about 45 minutes a day or she will do a 'senior workout' on tv.   Anxiety depression: doing so much better on prozac 20 mg once daily. No SI HI. Husband has been very ill so has been providing him with care at home.   MVP: has f/u with cardiologist and echocardiogram in the next one month. Still on atenolol  Past Medical History:  Diagnosis Date   Hyperlipidemia     Past Surgical History:  Procedure Laterality Date   NO PAST SURGERIES      Family History  Problem Relation Age of Onset   Hypertension Mother    Breast cancer Neg Hx     Social History   Socioeconomic History   Marital status: Married    Spouse name: Not on file   Number of children: 1   Years of education: Not on file   Highest education level: Not on file  Occupational History   Occupation: retired  Tobacco Use   Smoking status: Never   Smokeless tobacco: Never  Vaping Use   Vaping Use: Never used  Substance and Sexual Activity   Alcohol use: Yes    Alcohol/week: 3.0 standard drinks of alcohol    Types: 3 Glasses of wine per week    Comment: glass of wine three times week   Drug use: Never   Sexual  activity: Yes    Partners: Male    Birth control/protection: Post-menopausal  Other Topics Concern   Not on file  Social History Narrative   Not on file   Social Determinants of Health   Financial Resource Strain: Low Risk  (09/03/2021)   Overall Financial Resource Strain (CARDIA)    Difficulty of Paying Living Expenses: Not hard at all  Food Insecurity: No Food Insecurity (09/03/2021)   Hunger Vital Sign    Worried About Running Out of Food in the Last Year: Never true    Ran Out of Food in the Last Year: Never true  Transportation Needs: No Transportation Needs (09/03/2021)   PRAPARE - Hydrologist (Medical): No    Lack of Transportation (Non-Medical): No  Physical Activity: Sufficiently Active (09/03/2021)   Exercise Vital Sign    Days of Exercise per Week: 5 days    Minutes of Exercise per Session: 30 min  Stress: No Stress Concern Present (09/03/2021)   [place]    Feeling of Stress : Not at all  Social Connections: Unknown (09/03/2021)   Social Connection and Isolation Panel [NHANES]    Frequency of Communication  with Friends and Family: Not on file    Frequency of Social Gatherings with Friends and Family: Not on file    Attends Religious Services: Not on file    Active Member of Clubs or Organizations: Not on file    Attends Archivist Meetings: Not on file    Marital Status: Married  Intimate Partner Violence: Not At Risk (09/03/2021)   Humiliation, Afraid, Rape, and Kick questionnaire    Fear of Current or Ex-Partner: No    Emotionally Abused: No    Physically Abused: No    Sexually Abused: No    Outpatient Medications Prior to Visit  Medication Sig Dispense Refill   atenolol (TENORMIN) 25 MG tablet Take 1 tablet (25 mg total) by mouth daily. 90 tablet 1   CALCIUM-VITAMIN D PO Take by mouth.     FLUoxetine (PROZAC) 20 MG capsule Take 1 capsule (20 mg total) by  mouth daily. 90 capsule 3   Krill Oil 350 MG CAPS Take by mouth daily.     Multiple Vitamin (MULTIVITAMIN ADULT PO) Take by mouth.     rosuvastatin (CRESTOR) 5 MG tablet Take 1 tablet (5 mg total) by mouth daily. 90 tablet 3   tretinoin (RETIN-A) 0.1 % cream SMARTSIG:Sparingly Topical Every Night     Vitamin D-Vitamin K (VITAMIN K2-VITAMIN D3 PO) Take by mouth.     No facility-administered medications prior to visit.    No Known Allergies  ROS Review of Systems  Review of Systems  Respiratory:  Negative for shortness of breath.   Cardiovascular:  Negative for chest pain and palpitations.  Gastrointestinal:  Negative for constipation and diarrhea.  Genitourinary:  Negative for dysuria, frequency and urgency.  Musculoskeletal:  Negative for myalgias.  Psychiatric/Behavioral:  Negative for depression and suicidal ideas.   All other systems reviewed and are negative.    Objective:    Physical Exam  Gen: NAD, resting comfortably HEENT: TMs normal bilaterally. OP clear. No thyromegaly noted.  CV: RRR with no murmurs appreciated Pulm: NWOB, CTAB with no crackles, wheezes, or rhonchi GI: Normal bowel sounds present. Soft, Nontender, Nondistended. MSK: no edema, cyanosis, or clubbing noted Skin: warm, dry Neuro: CN2-12 grossly intact. Strength 5/5 in upper and lower extremities.  Psych: Normal affect and thought content  BP 128/76   Pulse (!) 56   Temp 98.6 F (37 C)   Resp 16   Ht '5\' 7"'$  (1.702 m)   Wt 180 lb 2 oz (81.7 kg)   SpO2 96%   BMI 28.21 kg/m  Wt Readings from Last 3 Encounters:  08/04/22 180 lb 2 oz (81.7 kg)  03/20/22 179 lb 8 oz (81.4 kg)  10/08/21 178 lb 3.2 oz (80.8 kg)     Health Maintenance Due  Topic Date Due   Zoster Vaccines- Shingrix (1 of 2) Never done   COVID-19 Vaccine (3 - Moderna risk series) 12/28/2020   INFLUENZA VACCINE  07/22/2022    There are no preventive care reminders to display for this patient.  Lab Results  Component Value  Date   TSH 2.00 05/31/2021   Lab Results  Component Value Date   WBC 8.5 05/31/2021   HGB 14.2 05/31/2021   HCT 41.8 05/31/2021   MCV 93.6 05/31/2021   PLT 300.0 05/31/2021   Lab Results  Component Value Date   NA 136 05/31/2021   K 3.9 05/31/2021   CO2 26 05/31/2021   GLUCOSE 90 05/31/2021   BUN 14 05/31/2021   CREATININE  0.77 05/31/2021   BILITOT 0.6 05/31/2021   ALKPHOS 78 05/31/2021   AST 20 05/31/2021   ALT 22 05/31/2021   PROT 6.6 05/31/2021   ALBUMIN 4.3 05/31/2021   CALCIUM 8.9 05/31/2021   GFR 79.96 05/31/2021   Lab Results  Component Value Date   CHOL 160 05/31/2021   Lab Results  Component Value Date   HDL 57 05/31/2021   Lab Results  Component Value Date   LDLCALC 88 05/31/2021   Lab Results  Component Value Date   TRIG 76 05/31/2021   No results found for: "CHOLHDL" No results found for: "HGBA1C"    Assessment & Plan:   Problem List Items Addressed This Visit       Cardiovascular and Mediastinum   Mitral valve prolapse    Continue atenolol  Continue f/u with cardiologist as scheduled        Musculoskeletal and Integument   Osteopenia    Continue vitamin d and calcium daily        Other   Depression, recurrent ([facility])    Continue prozac 20 mg  Pt does not want to increase today good at this dose. Consider therapy      Hyperlipidemia    Ordered lipid panel, pending results. Work on low cholesterol diet and exercise as tolerated Continue rosuvastatin 5 mg       Relevant Orders   Lipid panel   Encounter for general adult medical examination without abnormal findings    Patient Counseling(The following topics were reviewed):  Preventative care handout given to pt  Health maintenance and immunizations reviewed. Please refer to Health maintenance section. Pt advised on safe sex, wearing seatbelts in car, and proper nutrition labwork ordered today for annual Dental health: Discussed importance of regular tooth brushing,  flossing, and dental visits.        Relevant Orders   B12 and Folate Panel   CBC   Comprehensive metabolic panel   Lipid panel   Memory loss    b12 folate today Also increased anxiety state which may be contributing.      Relevant Orders   B12 and Folate Panel   CBC   Comprehensive metabolic panel   Screening mammogram for breast cancer    Mammogram ordered. Pending results. Do not schedule until after 08/27/21      Relevant Orders   MM 3D SCREEN BREAST BILATERAL   Postmenopausal - Primary    dexa due 2024      Relevant Orders   CBC   Comprehensive metabolic panel   Low vitamin D level    Ordered vitamin d pending results.   Advised to stop additional vitamin D supplementation of 5000 IU as daily MVI has 2000 IU once daily which is good amount.      Relevant Orders   VITAMIN D 25 Hydroxy (Vit-D Deficiency, Fractures)    No orders of the defined types were placed in this encounter.   Follow-up: No follow-ups on file.    Eugenia Pancoast, FNP

## 2022-08-04 NOTE — Assessment & Plan Note (Signed)
Continue atenolol  Continue f/u with cardiologist as scheduled

## 2022-08-04 NOTE — Assessment & Plan Note (Signed)
Continue prozac 20 mg  Pt does not want to increase today good at this dose. Consider therapy

## 2022-08-04 NOTE — Assessment & Plan Note (Signed)
Ordered lipid panel, pending results. Work on low cholesterol diet and exercise as tolerated Continue rosuvastatin 5 mg

## 2022-08-04 NOTE — Assessment & Plan Note (Signed)
Ordered vitamin d pending results.   Advised to stop additional vitamin D supplementation of 5000 IU as daily MVI has 2000 IU once daily which is good amount.

## 2022-08-04 NOTE — Patient Instructions (Signed)
I have sent an electronic order over to your preferred location for the following:   '[]'$   2D Mammogram  '[x]'$   3D Mammogram  '[]'$   Bone Density   Please give this center a call to get scheduled at your convenience.  '[x]'$   [facility]  [place] [address]  [phone]  Make sure to wear two piece  clothing  No lotions powders or deodorants the day of the appointment Make sure to bring picture ID and insurance card.  Bring list of medications you are currently taking including any supplements.   Stop by the lab prior to leaving today. I will notify you of your results once received.

## 2022-08-04 NOTE — Assessment & Plan Note (Signed)
Patient Counseling(The following topics were reviewed): ? Preventative care handout given to pt  ?Health maintenance and immunizations reviewed. Please refer to Health maintenance section. ?Pt advised on safe sex, wearing seatbelts in car, and proper nutrition ?labwork ordered today for annual ?Dental health: Discussed importance of regular tooth brushing, flossing, and dental visits. ? ? ?

## 2022-08-04 NOTE — Assessment & Plan Note (Signed)
dexa due 2024

## 2022-08-04 NOTE — Assessment & Plan Note (Signed)
Continue vitamin d and calcium daily

## 2022-08-04 NOTE — Assessment & Plan Note (Signed)
Mammogram ordered. Pending results. Do not schedule until after 08/27/21

## 2022-09-10 ENCOUNTER — Other Ambulatory Visit: Payer: Self-pay | Admitting: Family

## 2022-09-10 ENCOUNTER — Ambulatory Visit (INDEPENDENT_AMBULATORY_CARE_PROVIDER_SITE_OTHER): Payer: Medicare Other

## 2022-09-10 VITALS — Ht 67.0 in | Wt 180.0 lb

## 2022-09-10 DIAGNOSIS — Z0000 Encounter for general adult medical examination without abnormal findings: Secondary | ICD-10-CM

## 2022-09-10 NOTE — Progress Notes (Signed)
Virtual Visit via Telephone Note  I connected with  Misty Walker on 09/10/22 at 10:00 AM EDT by telephone and verified that I am speaking with the correct person using two identifiers.  Location: Patient: home Provider: [name] Persons participating in the virtual visit: [name]   I discussed the limitations, risks, security and privacy concerns of performing an evaluation and management service by telephone and the availability of in person appointments. The patient expressed understanding and agreed to proceed.  Interactive audio and video telecommunications were attempted between this nurse and patient, however failed, due to patient having technical difficulties OR patient did not have access to video capability.  We continued and completed visit with audio only.  Some vital signs may be absent or patient reported.   [name], LPN  Subjective:   Misty Walker is a 66 y.o. female who presents for Medicare Annual (Subsequent) preventive examination.  Review of Systems     Cardiac Risk Factors include: advanced age (>22mn, >>54women)     Objective:    There were no vitals filed for this visit. There is no height or weight on file to calculate BMI.     09/10/2022   10:00 AM 09/03/2021   11:15 AM  Advanced Directives  Does Patient Have a Medical Advance Directive? Yes Yes  Type of [place]amed[place]of [place] will [place] will  Does patient want to make changes to medical advance directive? Yes (Inpatient - patient defers changing a medical advance directive and declines information at this time) No - Patient declined  Copy of HBolivarin Chart? No - copy requested No - copy requested    Current Medications (verified) Outpatient Encounter Medications as of 09/10/2022  Medication Sig   atenolol (TENORMIN) 25 MG tablet Take 1 tablet (25 mg total) by mouth daily.    CALCIUM-VITAMIN D PO Take by mouth.   FLUoxetine (PROZAC) 20 MG capsule Take 1 capsule (20 mg total) by mouth daily.   Krill Oil 350 MG CAPS Take by mouth daily.   Multiple Vitamin (MULTIVITAMIN ADULT PO) Take by mouth.   rosuvastatin (CRESTOR) 5 MG tablet Take 1 tablet (5 mg total) by mouth daily.   tretinoin (RETIN-A) 0.1 % cream SMARTSIG:Sparingly Topical Every Night   Vitamin D-Vitamin K (VITAMIN K2-VITAMIN D3 PO) Take by mouth. (Patient not taking: Reported on 09/10/2022)   No facility-administered encounter medications on file as of 09/10/2022.    Allergies (verified) Patient has no known allergies.   History: Past Medical History:  Diagnosis Date   Hyperlipidemia    Past Surgical History:  Procedure Laterality Date   NO PAST SURGERIES     Family History  Problem Relation Age of Onset   Hypertension Mother    Breast cancer Neg Hx    Social History   Socioeconomic History   Marital status: Married    Spouse name: Not on file   Number of children: 1   Years of education: Not on file   Highest education level: Not on file  Occupational History   Occupation: retired  Tobacco Use   Smoking status: Never   Smokeless tobacco: Never  Vaping Use   Vaping Use: Never used  Substance and Sexual Activity   Alcohol use: Yes    Alcohol/week: 3.0 standard drinks of alcohol    Types: 3 Glasses of wine per week    Comment: glass of wine three times week  Drug use: Never   Sexual activity: Yes    Partners: Male    Birth control/protection: Post-menopausal  Other Topics Concern   Not on file  Social History Narrative   Not on file   Social Determinants of Health   Financial Resource Strain: Low Risk  (09/10/2022)   Overall Financial Resource Strain (CARDIA)    Difficulty of Paying Living Expenses: Not hard at all  Food Insecurity: No Food Insecurity (09/10/2022)   Hunger Vital Sign    Worried About Running Out of Food in the Last Year: Never true    Ran Out of Food in  the Last Year: Never true  Transportation Needs: No Transportation Needs (09/10/2022)   PRAPARE - Hydrologist (Medical): No    Lack of Transportation (Non-Medical): No  Physical Activity: Sufficiently Active (09/10/2022)   Exercise Vital Sign    Days of Exercise per Week: 5 days    Minutes of Exercise per Session: 40 min  Stress: No Stress Concern Present (09/10/2022)   [place]    Feeling of Stress : Only a little  Social Connections: Moderately Integrated (09/10/2022)   Social Connection and Isolation Panel [NHANES]    Frequency of Communication with Friends and Family: More than three times a week    Frequency of Social Gatherings with Friends and Family: Twice a week    Attends Religious Services: More than 4 times per year    Active Member of [employer] or Organizations: No    Attends Music therapist: Never    Marital Status: Married    Tobacco Counseling Counseling given: Not Answered   Clinical Intake:  Pre-visit preparation completed: Yes  Pain : No/denies pain     Nutritional Risks: None Diabetes: No  How often do you need to have someone help you when you read instructions, pamphlets, or other written materials from your doctor or pharmacy?: 1 - Never  Diabetic?no  Interpreter Needed?: No  Information entered by :: Kirke Shaggy, LPN   Activities of Daily Living    09/10/2022   10:01 AM  In your present state of health, do you have any difficulty performing the following activities:  Hearing? 0  Vision? 0  Difficulty concentrating or making decisions? 0  Walking or climbing stairs? 0  Dressing or bathing? 0  Doing errands, shopping? 0  Preparing Food and eating ? N  Using the Toilet? N  In the past six months, have you accidently leaked urine? N  Do you have problems with loss of bowel control? N  Managing your Medications? N  Managing your  Finances? N  Housekeeping or managing your Housekeeping? N    Patient Care Team: Eugenia Pancoast, FNP as PCP - General (Family Medicine) Kate Sable, MD as Consulting Physician (Cardiology)  Indicate any recent Medical Services you may have received from other than [facility] providers in the past year (date may be approximate).     Assessment:   This is a routine wellness examination for [facility]  Hearing/Vision screen Hearing Screening - Comments:: No aids Vision Screening - Comments:: Wears glasses- [facility]  Dietary issues and exercise activities discussed: Current Exercise Habits: Home exercise routine, Type of exercise: walking, Time (Minutes): 45, Frequency (Times/Week): 5, Weekly Exercise (Minutes/Week): 225, Intensity: Mild   Goals Addressed             This Visit's Progress    DIET - EAT MORE FRUITS  AND VEGETABLES         Depression Screen    09/10/2022    9:59 AM 09/10/2022    9:58 AM 09/03/2021   10:04 AM 05/21/2021    3:30 PM  PHQ 2/9 Scores  PHQ - 2 Score 1 0 0 3  PHQ- 9 Score 3 0  5    Fall Risk    09/10/2022   10:01 AM 09/03/2021   10:03 AM  Fall Risk   Falls in the past year? 0 0  Number falls in past yr: 0   Injury with Fall? 0 0  Risk for fall due to : No Fall Risks   Follow up Falls prevention discussed Falls evaluation completed    FALL RISK PREVENTION PERTAINING TO THE HOME:  Any stairs in or around the home? Yes  If so, are there any without handrails? No  Home free of loose throw rugs in walkways, pet beds, electrical cords, etc? Yes  Adequate lighting in your home to reduce risk of falls? Yes   ASSISTIVE DEVICES UTILIZED TO PREVENT FALLS:  Life alert? No  Use of a cane, walker or w/c? No  Grab bars in the bathroom? No  Shower chair or bench in shower? Yes  Elevated toilet seat or a handicapped toilet? Yes    Cognitive Function:        09/10/2022   10:02 AM  6CIT Screen  What Year? 0 points  What month? 0  points  What time? 0 points  Count back from 20 0 points  Months in reverse 0 points  Repeat phrase 0 points  Total Score 0 points    Immunizations Immunization History  Administered Date(s) Administered   Fluad Quad(high Dose 65+) 09/17/2017, 09/24/2018, 09/05/2019, 09/03/2021   Hepatitis A 05/02/2015, 11/07/2015   Influenza Nasal 09/28/2014   Influenza Split 11/29/2011   Influenza, High Dose Seasonal PF 10/17/2013   Influenza-Unspecified 09/27/2012, 09/18/2015, 08/29/2020   Moderna SARS-COV2 Booster Vaccination 11/30/2020   Moderna Sars-Covid-2 Vaccination 02/11/2020, 03/10/2020   Pneumococcal Conjugate-13 10/15/2017   Pneumococcal Polysaccharide-23 08/22/2019   Tdap 08/22/2019   Zoster, Live 04/13/2014    TDAP status: Up to date  Flu Vaccine status: Up to date  Pneumococcal vaccine status: Up to date  Covid-19 vaccine status: Completed vaccines  Qualifies for Shingles Vaccine? Yes   Zostavax completed No   Shingrix Completed?: No.    Education has been provided regarding the importance of this vaccine. Patient has been advised to call insurance company to determine out of pocket expense if they have not yet received this vaccine. Advised may also receive vaccine at local pharmacy or Health Dept. Verbalized acceptance and understanding.  Screening Tests Health Maintenance  Topic Date Due   Zoster Vaccines- Shingrix (1 of 2) Never done   COVID-19 Vaccine (3 - Moderna risk series) 12/28/2020   INFLUENZA VACCINE  07/22/2022   COLONOSCOPY (Pts 45-103yr Insurance coverage will need to be confirmed)  12/22/2022   MAMMOGRAM  08/28/2023   TETANUS/TDAP  08/21/2029   Pneumonia Vaccine 68 Years old  Completed   DEXA SCAN  Completed   HPV VACCINES  Aged Out   Hepatitis C Screening  Discontinued    Health Maintenance  Health Maintenance Due  Topic Date Due   Zoster Vaccines- Shingrix (1 of 2) Never done   COVID-19 Vaccine (3 - Moderna risk series) 12/28/2020    INFLUENZA VACCINE  07/22/2022    Colorectal cancer screening: Type of screening: Colonoscopy. Completed 12/22/12. Repeat  every 10 years  Mammogram status: Completed 08/27/21. Repeat every year- has appt 10/16/22  Bone Density status: Completed 08/27/21. Results reflect: Bone density results: OSTEOPENIA. Repeat every 5 years.  Lung Cancer Screening: (Low Dose CT Chest recommended if Age 68-80 years, 30 pack-year currently smoking OR have quit w/in 15years.) does not qualify.    Additional Screening:  Hepatitis C Screening: does qualify; Completed no  Vision Screening: Recommended annual ophthalmology exams for early detection of glaucoma and other disorders of the eye. Is the patient up to date with their annual eye exam?  Yes  Who is the provider or what is the name of the office in which the patient attends annual eye exams? [facility] a provider to establish care? No .   Dental Screening: Recommended annual dental exams for proper oral hygiene  Community Resource Referral / Chronic Care Management: CRR required this visit?  No   CCM required this visit?  No      Plan:     I have personally reviewed and noted the following in the patient's chart:   Medical and social history Use of alcohol, tobacco or illicit drugs  Current medications and supplements including opioid prescriptions. Patient is not currently taking opioid prescriptions. Functional ability and status Nutritional status Physical activity Advanced directives List of other physicians Hospitalizations, surgeries, and ER visits in previous 12 months Vitals Screenings to include cognitive, depression, and falls Referrals and appointments  In addition, I have reviewed and discussed with patient certain preventive protocols, quality metrics, and best practice recommendations. A written personalized care plan for preventive services as well as general preventive health recommendations were provided to  patient.     [name], LPN   [id number]   Nurse Notes: none

## 2022-09-10 NOTE — Patient Instructions (Signed)
Misty Walker , Thank you for taking time to come for your Medicare Wellness Visit. I appreciate your ongoing commitment to your health goals. Please review the following plan we discussed and let me know if I can assist you in the future.   Screening recommendations/referrals: Colonoscopy: 12/22/12 Mammogram: scheduled 10/16/22 Bone Density: 08/27/21 Recommended yearly ophthalmology/optometry visit for glaucoma screening and checkup Recommended yearly dental visit for hygiene and checkup  Vaccinations: Influenza vaccine: 09/03/21 Pneumococcal vaccine: 08/22/19 Tdap vaccine: 08/22/19 Shingles vaccine: n/d   Covid-19:02/11/20, 03/10/20, 11/30/20  Advanced directives: yes, need copies  Conditions/risks identified: none  Next appointment: Follow up in one year for your annual wellness visit 09/14/23 @ 9:45 am by phone   Preventive Care 65 Years and Older, Female Preventive care refers to lifestyle choices and visits with your health care provider that can promote health and wellness. What does preventive care include? A yearly physical exam. This is also called an annual well check. Dental exams once or twice a year. Routine eye exams. Ask your health care provider how often you should have your eyes checked. Personal lifestyle choices, including: Daily care of your teeth and gums. Regular physical activity. Eating a healthy diet. Avoiding tobacco and drug use. Limiting alcohol use. Practicing safe sex. Taking low-dose aspirin every day. Taking vitamin and mineral supplements as recommended by your health care provider. What happens during an annual well check? The services and screenings done by your health care provider during your annual well check will depend on your age, overall health, lifestyle risk factors, and family history of disease. Counseling  Your health care provider may ask you questions about your: Alcohol use. Tobacco use. Drug use. Emotional well-being. Home and  relationship well-being. Sexual activity. Eating habits. History of falls. Memory and ability to understand (cognition). Work and work Statistician. Reproductive health. Screening  You may have the following tests or measurements: Height, weight, and BMI. Blood pressure. Lipid and cholesterol levels. These may be checked every 5 years, or more frequently if you are over 53 years old. Skin check. Lung cancer screening. You may have this screening every year starting at age 71 if you have a 30-pack-year history of smoking and currently smoke or have quit within the past 15 years. Fecal occult blood test (FOBT) of the stool. You may have this test every year starting at age 25. Flexible sigmoidoscopy or colonoscopy. You may have a sigmoidoscopy every 5 years or a colonoscopy every 10 years starting at age 81. Hepatitis C blood test. Hepatitis B blood test. Sexually transmitted disease (STD) testing. Diabetes screening. This is done by checking your blood sugar (glucose) after you have not eaten for a while (fasting). You may have this done every 1-3 years. Bone density scan. This is done to screen for osteoporosis. You may have this done starting at age 5. Mammogram. This may be done every 1-2 years. Talk to your health care provider about how often you should have regular mammograms. Talk with your health care provider about your test results, treatment options, and if necessary, the need for more tests. Vaccines  Your health care provider may recommend certain vaccines, such as: Influenza vaccine. This is recommended every year. Tetanus, diphtheria, and acellular pertussis (Tdap, Td) vaccine. You may need a Td booster every 10 years. Zoster vaccine. You may need this after age 34. Pneumococcal 13-valent conjugate (PCV13) vaccine. One dose is recommended after age 31. Pneumococcal polysaccharide (PPSV23) vaccine. One dose is recommended after age 76. Talk to  your health care provider  about which screenings and vaccines you need and how often you need them. This information is not intended to replace advice given to you by your health care provider. Make sure you discuss any questions you have with your health care provider. Document Released: 01/04/2016 Document Revised: 08/27/2016 Document Reviewed: 10/09/2015 Elsevier Interactive Patient Education  2017 [place] Prevention in the Home Falls can cause injuries. They can happen to people of all ages. There are many things you can do to make your home safe and to help prevent falls. What can I do on the outside of my home? Regularly fix the edges of walkways and driveways and fix any cracks. Remove anything that might make you trip as you walk through a door, such as a raised step or threshold. Trim any bushes or trees on the path to your home. Use bright outdoor lighting. Clear any walking paths of anything that might make someone trip, such as rocks or tools. Regularly check to see if handrails are loose or broken. Make sure that both sides of any steps have handrails. Any raised decks and porches should have guardrails on the edges. Have any leaves, snow, or ice cleared regularly. Use sand or salt on walking paths during winter. Clean up any spills in your garage right away. This includes oil or grease spills. What can I do in the bathroom? Use night lights. Install grab bars by the toilet and in the tub and shower. Do not use towel bars as grab bars. Use non-skid mats or decals in the tub or shower. If you need to sit down in the shower, use a plastic, non-slip stool. Keep the floor dry. Clean up any water that spills on the floor as soon as it happens. Remove soap buildup in the tub or shower regularly. Attach bath mats securely with double-sided non-slip rug tape. Do not have throw rugs and other things on the floor that can make you trip. What can I do in the bedroom? Use night lights. Make sure  that you have a light by your bed that is easy to reach. Do not use any sheets or blankets that are too big for your bed. They should not hang down onto the floor. Have a firm chair that has side arms. You can use this for support while you get dressed. Do not have throw rugs and other things on the floor that can make you trip. What can I do in the kitchen? Clean up any spills right away. Avoid walking on wet floors. Keep items that you use a lot in easy-to-reach places. If you need to reach something above you, use a strong step stool that has a grab bar. Keep electrical cords out of the way. Do not use floor polish or wax that makes floors slippery. If you must use wax, use non-skid floor wax. Do not have throw rugs and other things on the floor that can make you trip. What can I do with my stairs? Do not leave any items on the stairs. Make sure that there are handrails on both sides of the stairs and use them. Fix handrails that are broken or loose. Make sure that handrails are as long as the stairways. Check any carpeting to make sure that it is firmly attached to the stairs. Fix any carpet that is loose or worn. Avoid having throw rugs at the top or bottom of the stairs. If you do have throw rugs, attach  them to the floor with carpet tape. Make sure that you have a light switch at the top of the stairs and the bottom of the stairs. If you do not have them, ask someone to add them for you. What else can I do to help prevent falls? Wear shoes that: Do not have high heels. Have rubber bottoms. Are comfortable and fit you well. Are closed at the toe. Do not wear sandals. If you use a stepladder: Make sure that it is fully opened. Do not climb a closed stepladder. Make sure that both sides of the stepladder are locked into place. Ask someone to hold it for you, if possible. Clearly mark and make sure that you can see: Any grab bars or handrails. First and last steps. Where the edge of  each step is. Use tools that help you move around (mobility aids) if they are needed. These include: Canes. Walkers. Scooters. Crutches. Turn on the lights when you go into a dark area. Replace any light bulbs as soon as they burn out. Set up your furniture so you have a clear path. Avoid moving your furniture around. If any of your floors are uneven, fix them. If there are any pets around you, be aware of where they are. Review your medicines with your doctor. Some medicines can make you feel dizzy. This can increase your chance of falling. Ask your doctor what other things that you can do to help prevent falls. This information is not intended to replace advice given to you by your health care provider. Make sure you discuss any questions you have with your health care provider. Document Released: 10/04/2009 Document Revised: 05/15/2016 Document Reviewed: 01/12/2015 Elsevier Interactive Patient Education  2017 [employer].

## 2022-09-18 ENCOUNTER — Ambulatory Visit: Payer: Medicare Other | Attending: Cardiology

## 2022-09-18 DIAGNOSIS — Z8679 Personal history of other diseases of the circulatory system: Secondary | ICD-10-CM | POA: Diagnosis not present

## 2022-09-18 DIAGNOSIS — I5030 Unspecified diastolic (congestive) heart failure: Secondary | ICD-10-CM

## 2022-09-18 DIAGNOSIS — I088 Other rheumatic multiple valve diseases: Secondary | ICD-10-CM | POA: Diagnosis not present

## 2022-09-18 LAB — ECHOCARDIOGRAM COMPLETE
AR max vel: 2.92 cm2
AV Area VTI: 2.99 cm2
AV Area mean vel: 2.67 cm2
AV Mean grad: 3 mmHg
AV Peak grad: 4.8 mmHg
Ao pk vel: 1.1 m/s
Area-P 1/2: 2.95 cm2
Calc EF: 56 %
S' Lateral: 3.4 cm
Single Plane A2C EF: 53.4 %
Single Plane A4C EF: 57.7 %

## 2022-09-25 ENCOUNTER — Ambulatory Visit (INDEPENDENT_AMBULATORY_CARE_PROVIDER_SITE_OTHER): Payer: Medicare Other

## 2022-09-25 DIAGNOSIS — Z23 Encounter for immunization: Secondary | ICD-10-CM | POA: Diagnosis not present

## 2022-10-16 ENCOUNTER — Ambulatory Visit
Admission: RE | Admit: 2022-10-16 | Discharge: 2022-10-16 | Disposition: A | Discharge status: Home / Self Care | Payer: Medicare Other | Source: Ambulatory Visit | Attending: Family | Admitting: Family

## 2022-10-16 DIAGNOSIS — Z1231 Encounter for screening mammogram for malignant neoplasm of breast: Secondary | ICD-10-CM | POA: Diagnosis not present

## 2022-10-28 IMAGING — MG MM DIGITAL SCREENING BILAT W/ TOMO AND CAD
8 series · 8 of 24 positions shown · non-contrast
Comparison: Previous exam(s).

CLINICAL DATA: Screening.

EXAM:
DIGITAL SCREENING BILATERAL MAMMOGRAM WITH TOMOSYNTHESIS AND CAD
TECHNIQUE: Bilateral screening digital craniocaudal and mediolateral oblique
mammograms were obtained. Bilateral screening digital breast
tomosynthesis was performed. The images were evaluated with
computer-aided detection.

[L MLO synth-2D]
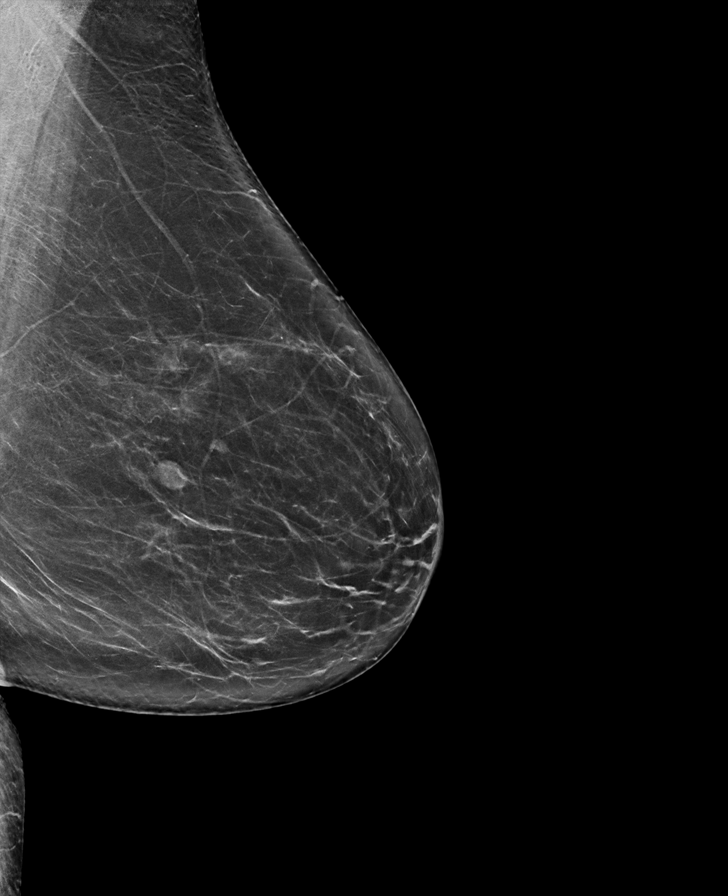

[L CC synth-2D]
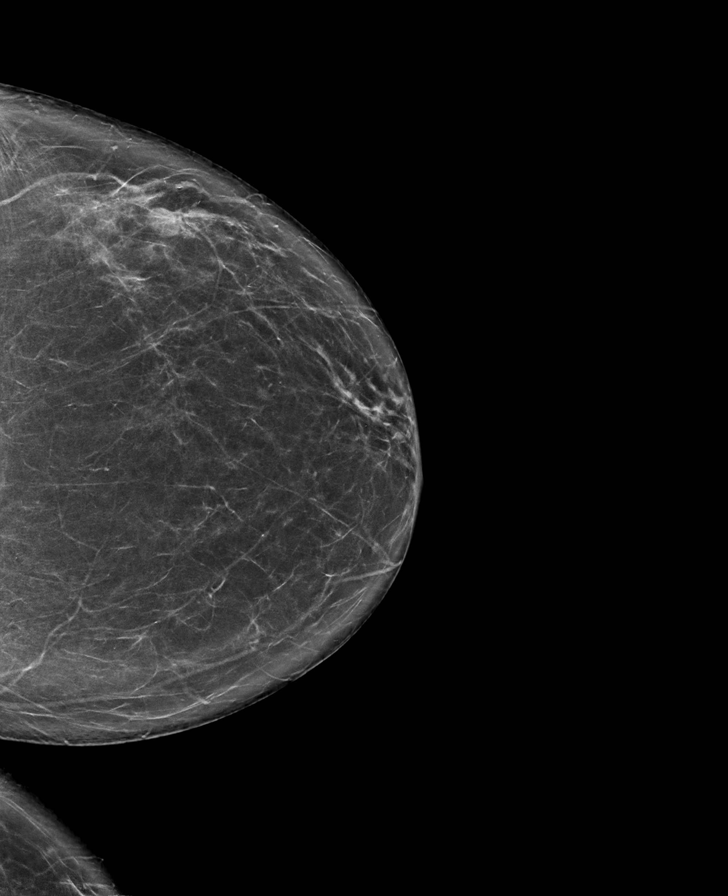

[R CC synth-2D]
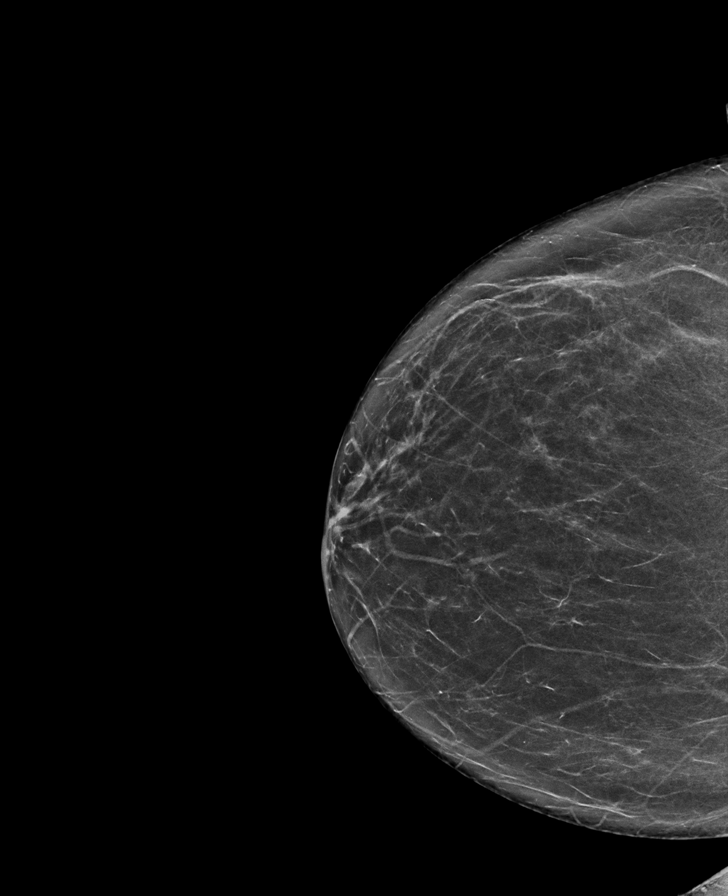

[R MLO synth-2D]
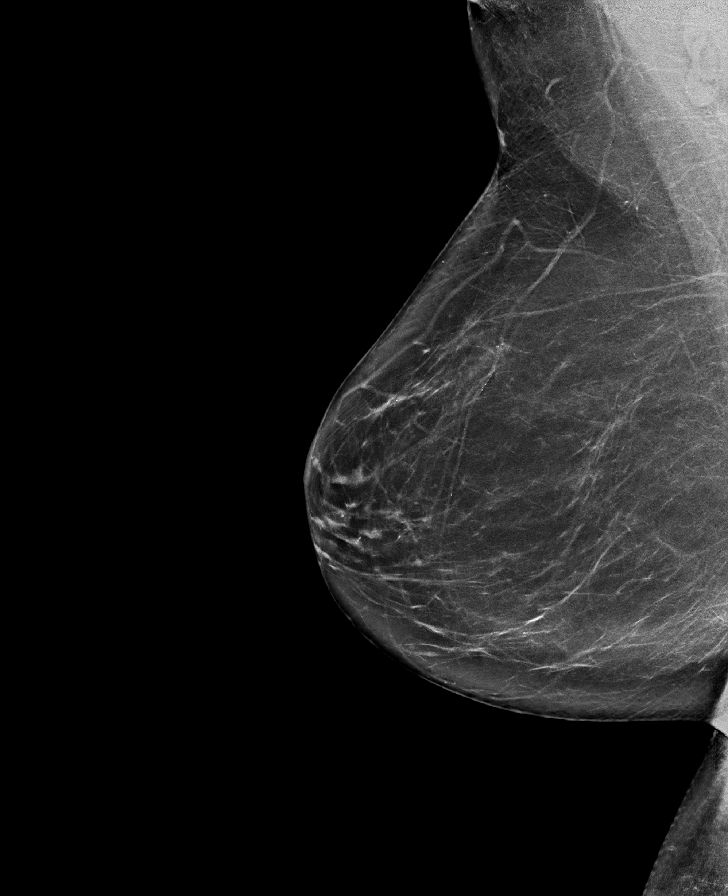

[L MLO tomo · tomo slice 40/79.0]
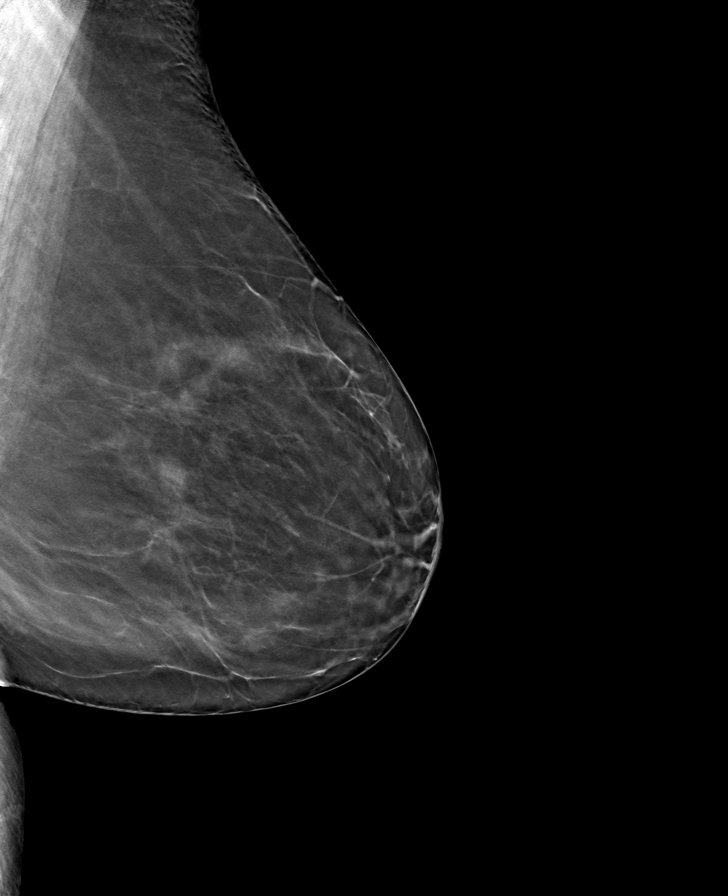

[R CC tomo · tomo slice 35/69.0]
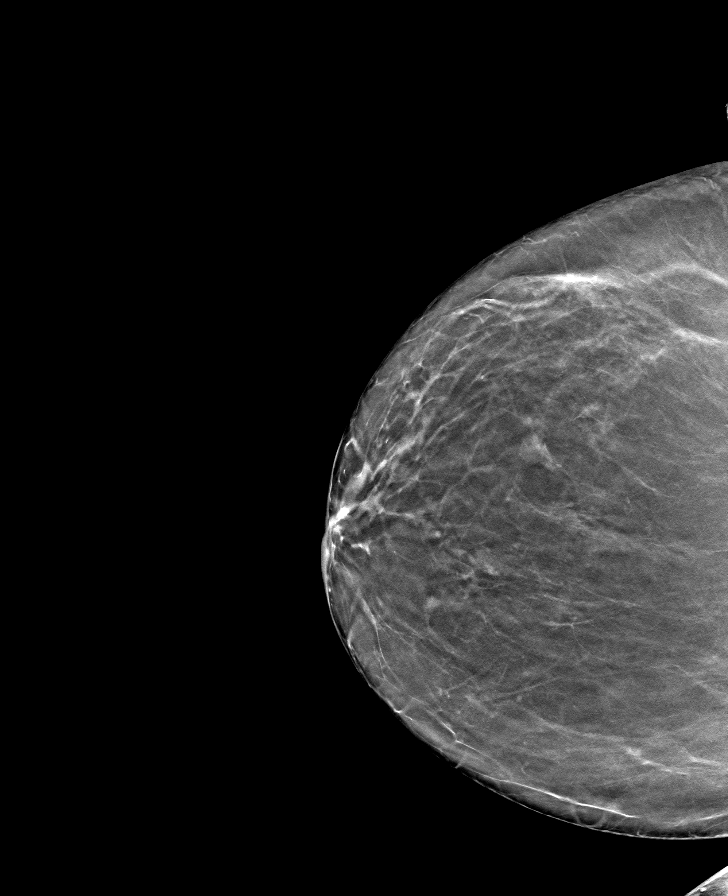

[R MLO tomo · tomo slice 41/82.0]
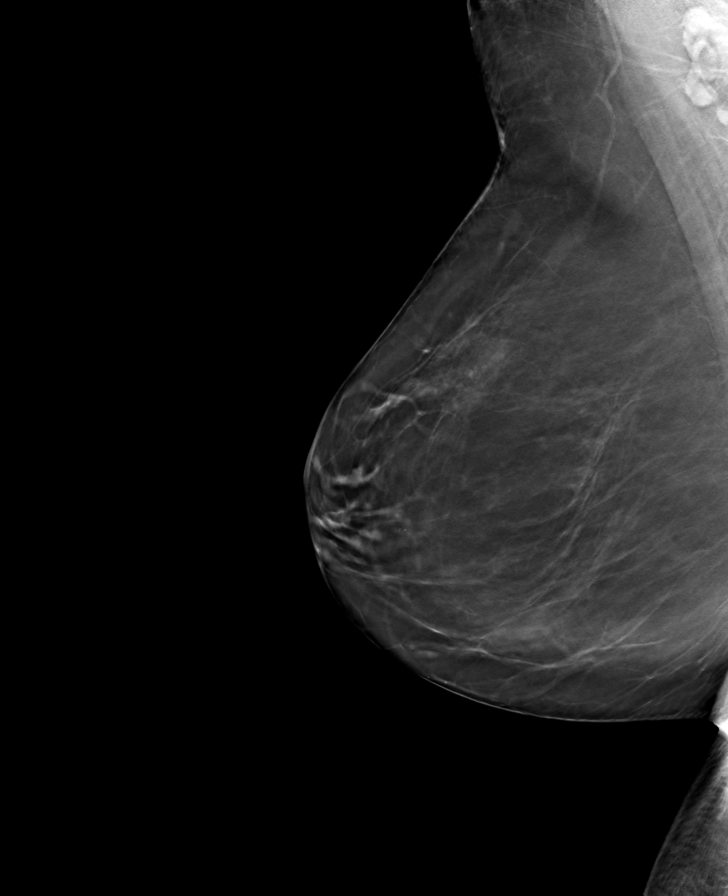

[L CC tomo · tomo slice 37/73.0]
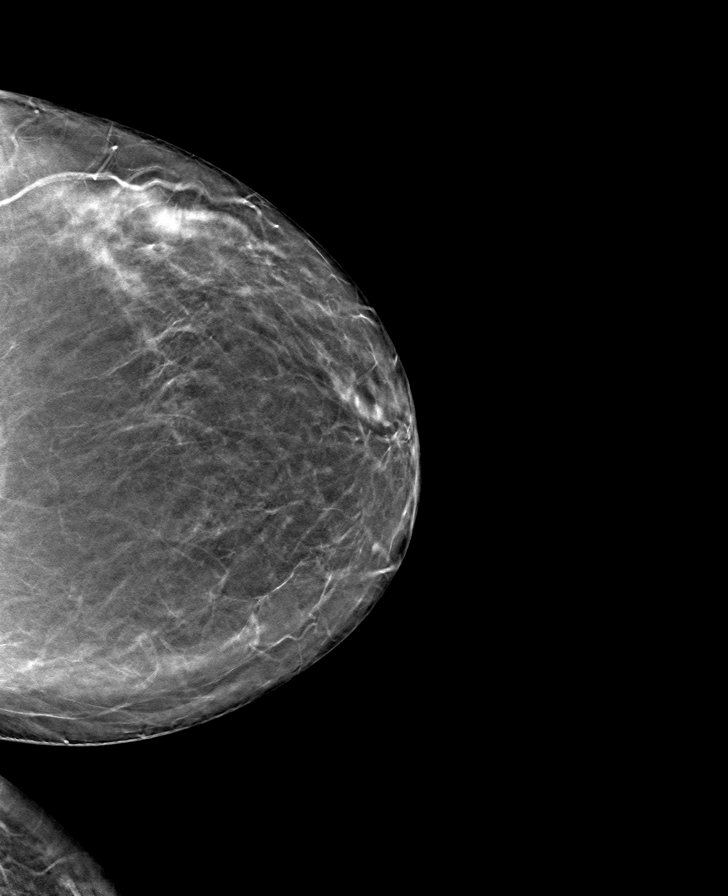

[8 of 24 positions shown; findings below may reference images not displayed]

ACR Breast Density Category b: There are scattered areas of
fibroglandular density.
FINDINGS: There are no findings suspicious for malignancy.
IMPRESSION: No mammographic evidence of malignancy. A result letter of this
screening mammogram will be mailed directly to the patient.

RECOMMENDATION:
Screening mammogram in one year. (Code:51-O-LD2)

BI-RADS CATEGORY  1: Negative.

## 2022-12-29 ENCOUNTER — Ambulatory Visit: Payer: Medicare Other | Admitting: Family

## 2023-03-16 ENCOUNTER — Other Ambulatory Visit: Payer: Self-pay | Admitting: Family

## 2023-03-16 DIAGNOSIS — E785 Hyperlipidemia, unspecified: Secondary | ICD-10-CM

## 2023-03-17 DIAGNOSIS — J300 Vasomotor rhinitis: Secondary | ICD-10-CM | POA: Diagnosis not present

## 2023-03-17 DIAGNOSIS — L570 Actinic keratosis: Secondary | ICD-10-CM | POA: Diagnosis not present

## 2023-03-17 DIAGNOSIS — L650 Telogen effluvium: Secondary | ICD-10-CM | POA: Diagnosis not present

## 2023-03-17 DIAGNOSIS — D2272 Melanocytic nevi of left lower limb, including hip: Secondary | ICD-10-CM | POA: Diagnosis not present

## 2023-03-17 DIAGNOSIS — Z85828 Personal history of other malignant neoplasm of skin: Secondary | ICD-10-CM | POA: Diagnosis not present

## 2023-03-17 DIAGNOSIS — D225 Melanocytic nevi of trunk: Secondary | ICD-10-CM | POA: Diagnosis not present

## 2023-03-17 DIAGNOSIS — Z08 Encounter for follow-up examination after completed treatment for malignant neoplasm: Secondary | ICD-10-CM | POA: Diagnosis not present

## 2023-04-02 ENCOUNTER — Other Ambulatory Visit: Payer: Self-pay | Admitting: Family

## 2023-04-02 DIAGNOSIS — F339 Major depressive disorder, recurrent, unspecified: Secondary | ICD-10-CM

## 2023-04-10 ENCOUNTER — Telehealth: Payer: Medicare Other | Admitting: Physician Assistant

## 2023-04-10 DIAGNOSIS — R232 Flushing: Secondary | ICD-10-CM

## 2023-04-10 DIAGNOSIS — T7840XA Allergy, unspecified, initial encounter: Secondary | ICD-10-CM | POA: Diagnosis not present

## 2023-04-10 MED ORDER — PREDNISONE 10 MG (21) PO TBPK: ORAL_TABLET | ORAL | 0 refills | Status: DC

## 2023-04-10 NOTE — Progress Notes (Signed)
Virtual Visit Consent   Misty Walker, you are scheduled for a virtual visit with a [facility] provider today. Just as with appointments in the office, your consent must be obtained to participate. Your consent will be active for this visit and any virtual visit you may have with one of our providers in the next 365 days. If you have a MyChart account, a copy of this consent can be sent to you electronically.  As this is a virtual visit, video technology does not allow for your provider to perform a traditional examination. This may limit your provider's ability to fully assess your condition. If your provider identifies any concerns that need to be evaluated in person or the need to arrange testing (such as labs, EKG, etc.), we will make arrangements to do so. Although advances in technology are sophisticated, we cannot ensure that it will always work on either your end or our end. If the connection with a video visit is poor, the visit may have to be switched to a telephone visit. With either a video or telephone visit, we are not always able to ensure that we have a secure connection.  By engaging in this virtual visit, you consent to the provision of healthcare and authorize for your insurance to be billed (if applicable) for the services provided during this visit. Depending on your insurance coverage, you may receive a charge related to this service.  I need to obtain your verbal consent now. Are you willing to proceed with your visit today? Misty Walker has provided verbal consent on 04/10/2023 for a virtual visit (video or telephone). Misty Walker, [place]  Date: 04/10/2023 4:13 PM  Virtual Visit via Video Note   I, Misty Walker, connected with  Misty Walker  ([id number], [birth year]) on 04/10/23 at  4:00 PM EDT by a video-enabled telemedicine application and verified that I am speaking with the correct person using two identifiers.  Location: Patient: Virtual Visit Location Patient:  Home Provider: Virtual Visit Location Provider: Home Office   I discussed the limitations of evaluation and management by telemedicine and the availability of in person appointments. The patient expressed understanding and agreed to proceed.    History of Present Illness: Misty Walker is a 67 y.o. who identifies as a female who was assigned female at birth, and is being seen today for facial flushing and redness of her face over the past 1-2 days. Notes this happened about 2 months ago as well, lasting about a week before resolving. Notes the area is burning and itching in nature. Recently started use of a new sunscreen each time prior to development of rash.   .  HPI: HPI  Problems:  Patient Active Problem List   Diagnosis Date Noted   Encounter for general adult medical examination without abnormal findings 08/04/2022   Memory loss 08/04/2022   Screening mammogram for breast cancer 08/04/2022   Postmenopausal 08/04/2022   Low vitamin D level 08/04/2022   Hyperlipidemia    Osteopenia    Depression, recurrent 05/21/2021   Mitral valve prolapse 05/21/2021    Allergies: No Known Allergies Medications:  Current Outpatient Medications:    atenolol (TENORMIN) 25 MG tablet, TAKE 1 TABLET BY MOUTH DAILY, Disp: 100 tablet, Rfl: 2   CALCIUM-VITAMIN D PO, Take by mouth., Disp: , Rfl:    FLUoxetine (PROZAC) 20 MG capsule, TAKE 1 CAPSULE BY MOUTH DAILY, Disp: 90 capsule, Rfl: 3   Krill Oil 350 MG CAPS, Take by mouth daily., Disp: ,  Rfl:    Multiple Vitamin (MULTIVITAMIN ADULT PO), Take by mouth., Disp: , Rfl:    rosuvastatin (CRESTOR) 5 MG tablet, TAKE 1 TABLET BY MOUTH DAILY, Disp: 100 tablet, Rfl: 2   tretinoin (RETIN-A) 0.1 % cream, SMARTSIG:Sparingly Topical Every Night, Disp: , Rfl:    Vitamin D-Vitamin K (VITAMIN K2-VITAMIN D3 PO), Take by mouth. (Patient not taking: Reported on 09/10/2022), Disp: , Rfl:   Observations/Objective: Patient is well-developed, well-nourished in no acute  distress.  Resting comfortably at home.  Head is normocephalic, atraumatic.  No labored breathing. Speech is clear and coherent with logical content.  Patient is alert and oriented at baseline.  Facial flushing, extending to her neck noted. Only in sun-exposed areas.  Assessment and Plan: 1. Allergic reaction, initial encounter  Suspect related to new cream she is applying to her face and neck as is contains a sunscreen. Stop suspected offending agent. Keep skin clean and dry. Start benadryl OTC to start as she wants to avoid use of steroids. Will place steroid on file at pharmacy to use as directed, if needed, for non-resolving symptoms. ER precautions reviewed.   Follow Up Instructions: I discussed the assessment and treatment plan with the patient. The patient was provided an opportunity to ask questions and all were answered. The patient agreed with the plan and demonstrated an understanding of the instructions.  A copy of instructions were sent to the patient via MyChart unless otherwise noted below.   The patient was advised to call back or seek an in-person evaluation if the symptoms worsen or if the condition fails to improve as anticipated.  Time:  I spent 10 minutes with the patient via telehealth technology discussing the above problems/concerns.    Misty Climes, PA-C

## 2023-04-10 NOTE — Patient Instructions (Addendum)
  Misty Walker, thank you for joining Piedad Climes, PA-C for today's virtual visit.  While this provider is not your primary care provider (PCP), if your PCP is located in our provider database this encounter information will be shared with them immediately following your visit.   A [facility] MyChart account gives you access to today's visit and all your visits, tests, and labs performed at [facility] " click here if you don't have a [facility] MyChart account or go to [email or web address]  Consent: (Patient) Misty Walker provided verbal consent for this virtual visit at the beginning of the encounter.  Current Medications:  Current Outpatient Medications:    atenolol (TENORMIN) 25 MG tablet, TAKE 1 TABLET BY MOUTH DAILY, Disp: 100 tablet, Rfl: 2   CALCIUM-VITAMIN D PO, Take by mouth., Disp: , Rfl:    FLUoxetine (PROZAC) 20 MG capsule, TAKE 1 CAPSULE BY MOUTH DAILY, Disp: 90 capsule, Rfl: 3   Krill Oil 350 MG CAPS, Take by mouth daily., Disp: , Rfl:    Multiple Vitamin (MULTIVITAMIN ADULT PO), Take by mouth., Disp: , Rfl:    rosuvastatin (CRESTOR) 5 MG tablet, TAKE 1 TABLET BY MOUTH DAILY, Disp: 100 tablet, Rfl: 2   tretinoin (RETIN-A) 0.1 % cream, SMARTSIG:Sparingly Topical Every Night, Disp: , Rfl:    Vitamin D-Vitamin K (VITAMIN K2-VITAMIN D3 PO), Take by mouth. (Patient not taking: Reported on 09/10/2022), Disp: , Rfl:    Medications ordered in this encounter:  No orders of the defined types were placed in this encounter.    *If you need refills on other medications prior to your next appointment, please contact your pharmacy*  Follow-Up: Call back or seek an in-person evaluation if the symptoms worsen or if the condition fails to improve as anticipated.  [facility] [phone]  Other Instructions Please keep skin clean and dry. Apply cool compresses to the face. Avoid showering in very hot water. Take OTC benadryl, starting with one 25 mg  tablet every 4-6 hours as needed. You can increase to 50 mg for evening dose. If not tolerating well or symptoms are not resolving, start the prednisone I have placed on file at your pharmacy.    If you have been instructed to have an in-person evaluation today at a local Urgent Care facility, please use the link below. It will take you to a list of all of our available [facility], including address, phone number and hours of operation. Please do not delay care.  [facility]  If you or a family member do not have a primary care provider, use the link below to schedule a visit and establish care. When you choose a [facility] primary care physician or advanced practice provider, you gain a long-term partner in health. Find a Primary Care Provider  Learn more about [facility]'s in-office and virtual care options:  - Get Care Now

## 2023-04-10 NOTE — Progress Notes (Signed)
Required video visit as no diagnosis in chart of rosacea and unable to upload a picture.

## 2023-05-06 ENCOUNTER — Encounter: Payer: Self-pay | Admitting: Family

## 2023-05-06 ENCOUNTER — Ambulatory Visit (INDEPENDENT_AMBULATORY_CARE_PROVIDER_SITE_OTHER): Payer: Medicare Other | Admitting: Family

## 2023-05-06 VITALS — BP 124/74 | HR 72 | Temp 97.9°F | Ht 67.0 in | Wt 185.6 lb

## 2023-05-06 DIAGNOSIS — U071 COVID-19: Secondary | ICD-10-CM | POA: Insufficient documentation

## 2023-05-06 DIAGNOSIS — J0100 Acute maxillary sinusitis, unspecified: Secondary | ICD-10-CM | POA: Insufficient documentation

## 2023-05-06 DIAGNOSIS — H00015 Hordeolum externum left lower eyelid: Secondary | ICD-10-CM | POA: Diagnosis not present

## 2023-05-06 LAB — POC COVID19 BINAXNOW: SARS Coronavirus 2 Ag: POSITIVE — AB

## 2023-05-06 LAB — POCT RAPID STREP A (OFFICE): Rapid Strep A Screen: NEGATIVE

## 2023-05-06 MED ORDER — AMOXICILLIN-POT CLAVULANATE 875-125 MG PO TABS: 1.0000 | ORAL_TABLET | Freq: Two times a day (BID) | ORAL | 0 refills | Status: DC

## 2023-05-06 MED ORDER — MOLNUPIRAVIR EUA 200MG CAPSULE: 4.0000 | ORAL_CAPSULE | Freq: Two times a day (BID) | ORAL | 0 refills | Status: AC

## 2023-05-06 NOTE — Progress Notes (Signed)
Established Patient Office Visit  Subjective:   Patient ID: Misty Walker, female    DOB: 09-22-1956  Age: 67 y.o. MRN: [id number]  CC:  Chief Complaint  Patient presents with   Eye Problem    Right eyelid is red and swollen     HPI: Misty Walker is a 67 y.o. female presenting on 05/06/2023 for Eye Problem (Right eyelid is red and swollen )   Eye Problem     A few days ago started with what she thought was a stye.  She states over the last few days she kept seeing her face getting red as well.   She does state about one month ago she had an episode where her face got all red and swollen, did a virtual appointment who suggest benadryl and was given rx prednisone. She didn't take the prednisone but did take the benadryl. Between then and now she has episodes where her face tingles but without rash. Right now she does state her face is tingling. She states she has not used any new shampoos and or facial lotions or wash. She has tried cerave since the onset   She has not used benadryl  She does have a bit of a cough for some time, dry cough. No sinus pressure, runny nose. Does have slight sore throat. Does have a sinus headache. No ear pain.         ROS: Negative unless specifically indicated above in HPI.   Relevant past medical history reviewed and updated as indicated.   Allergies and medications reviewed and updated.   Current Outpatient Medications:    amoxicillin-clavulanate (AUGMENTIN) 875-125 MG tablet, Take 1 tablet by mouth 2 (two) times daily., Disp: 20 tablet, Rfl: 0   atenolol (TENORMIN) 25 MG tablet, TAKE 1 TABLET BY MOUTH DAILY, Disp: 100 tablet, Rfl: 2   FLUoxetine (PROZAC) 20 MG capsule, TAKE 1 CAPSULE BY MOUTH DAILY, Disp: 90 capsule, Rfl: 3   Krill Oil 350 MG CAPS, Take by mouth daily., Disp: , Rfl:    molnupiravir EUA (LAGEVRIO) 200 mg CAPS capsule, Take 4 capsules (800 mg total) by mouth 2 (two) times daily for 5 days., Disp: 40 capsule, Rfl: 0   Multiple  Vitamin (MULTIVITAMIN ADULT PO), Take by mouth., Disp: , Rfl:    rosuvastatin (CRESTOR) 5 MG tablet, TAKE 1 TABLET BY MOUTH DAILY, Disp: 100 tablet, Rfl: 2   Vitamin D-Vitamin K (VITAMIN K2-VITAMIN D3 PO), Take by mouth., Disp: , Rfl:   No Known Allergies  Objective:   BP 124/74 (BP Location: Right Arm)   Pulse 72   Temp 97.9 F (36.6 C) (Temporal)   Ht 5\' 7"  (1.702 m)   Wt 185 lb 9.6 oz (84.2 kg)   SpO2 99%   BMI 29.07 kg/m    Physical Exam Constitutional:      General: She is not in acute distress.    Appearance: Normal appearance. She is normal weight. She is not ill-appearing, toxic-appearing or diaphoretic.  HENT:     Head: Normocephalic.     Right Ear: Tympanic membrane normal.     Left Ear: Tympanic membrane normal.     Nose:     Right Sinus: Maxillary sinus tenderness and frontal sinus tenderness present.     Left Sinus: Maxillary sinus tenderness and frontal sinus tenderness present.     Mouth/Throat:     Mouth: Mucous membranes are dry.     Pharynx: Posterior oropharyngeal erythema present. No oropharyngeal exudate.  Tonsils: 1+ on the right. 1+ on the left.  Eyes:     General:        Right eye: Hordeolum (inner lower eye) present.     Extraocular Movements: Extraocular movements intact.     Pupils: Pupils are equal, round, and reactive to light.  Cardiovascular:     Rate and Rhythm: Normal rate and regular rhythm.     Pulses: Normal pulses.     Heart sounds: Normal heart sounds.  Pulmonary:     Effort: Pulmonary effort is normal.     Breath sounds: Normal breath sounds.  Musculoskeletal:     Cervical back: Normal range of motion.  Neurological:     General: No focal deficit present.     Mental Status: She is alert and oriented to person, place, and time. Mental status is at baseline.  Psychiatric:        Mood and Affect: Mood normal.        Behavior: Behavior normal.        Thought Content: Thought content normal.        Judgment: Judgment normal.      Assessment & Plan:  Acute non-recurrent maxillary sinusitis Assessment & Plan: Prescription given for augmentin 875/125 mg po bid for ten days. Pt to continue tylenol/ibuprofen prn sinus pain. Continue with humidifier prn and steam showers recommended as well. instructed If no symptom improvement in 48 hours please f/u Did also suggest zyrtec   Orders: -     Amoxicillin-Pot Clavulanate; Take 1 tablet by mouth 2 (two) times daily.  Dispense: 20 tablet; Refill: 0 -     POC COVID-19 BinaxNow -     POCT rapid strep A  COVID-19 Assessment & Plan: Advised of CDC guidelines for self isolation/ ending isolation.  Advised of safe practice guidelines. Symptom Tier reviewed.  Encouraged to monitor for any worsening symptoms; watch for increased shortness of breath, weakness, and signs of dehydration. Advised when to seek emergency care.  Instructed to rest and hydrate well.  Advised to leave the house during recommended isolation period, only if it is necessary to seek medical care Pt considered high risk for hospitalization. have decided pt is a candidate for antiviral and pt agrees that she would like to take this. I have sent in RX for molnupiravir 200 mg capsules to be taken as directed.   Orders: -     molnupiravir EUA; Take 4 capsules (800 mg total) by mouth 2 (two) times daily for 5 days.  Dispense: 40 capsule; Refill: 0  Hordeolum externum of left lower eyelid Assessment & Plan: Warm compresses  If no improvement x two weeks will refer to opthamology      Follow up plan: No follow-ups on file.  Mort Sawyers, FNP

## 2023-05-06 NOTE — Patient Instructions (Signed)
Your COVID test is positive. You should remain isolated and quarantine for at least 5 days from start of symptoms. You must be feeling better and be fever free without any fever reducers for at least 24 hours as well. You should wear a mask at all times when out of your home or around others for 5 days after leaving isolation.  Your household contacts should be tested as well as work contacts. If you feel worse or have increasing shortness of breath, you should be seen in person at urgent care or the emergency room.   

## 2023-05-06 NOTE — Assessment & Plan Note (Signed)
Warm compresses  If no improvement x two weeks will refer to opthamology

## 2023-05-06 NOTE — Assessment & Plan Note (Signed)
Prescription given for augmentin 875/125 mg po bid for ten days. Pt to continue tylenol/ibuprofen prn sinus pain. Continue with humidifier prn and steam showers recommended as well. instructed If no symptom improvement in 48 hours please f/u Did also suggest zyrtec

## 2023-05-06 NOTE — Assessment & Plan Note (Signed)
Advised of CDC guidelines for self isolation/ ending isolation.  Advised of safe practice guidelines. Symptom Tier reviewed.  Encouraged to monitor for any worsening symptoms; watch for increased shortness of breath, weakness, and signs of dehydration. Advised when to seek emergency care.  Instructed to rest and hydrate well.  Advised to leave the house during recommended isolation period, only if it is necessary to seek medical care Pt considered high risk for hospitalization. have decided pt is a candidate for antiviral and pt agrees that she would like to take this. I have sent in RX for molnupiravir 200 mg capsules to be taken as directed.  

## 2023-05-08 ENCOUNTER — Encounter: Payer: Self-pay | Admitting: Family

## 2023-05-08 NOTE — Telephone Encounter (Signed)
FYI, as a heads up

## 2023-05-11 ENCOUNTER — Telehealth: Payer: Self-pay | Admitting: Family

## 2023-05-11 NOTE — Telephone Encounter (Signed)
Type of forms received: travel insured international   Routed to: [employer]   Paperwork received by : Audree Camel    Individual made aware of 3-5 business day turn around (Y/N): Y   Form completed and patient made aware of charges(Y/N): Y    Faxed to : pt requested a call back once ppw is comp @ [phone]  Form location:  dugal's folder

## 2023-05-11 NOTE — Telephone Encounter (Signed)
Ppw placed in [facility] box for review and completion.

## 2023-05-12 NOTE — Telephone Encounter (Signed)
Left message advising the form is completed and ready for pick up.

## 2023-05-13 DIAGNOSIS — Z0279 Encounter for issue of other medical certificate: Secondary | ICD-10-CM

## 2023-05-17 ENCOUNTER — Other Ambulatory Visit: Payer: Self-pay | Admitting: Family

## 2023-05-21 DIAGNOSIS — H00012 Hordeolum externum right lower eyelid: Secondary | ICD-10-CM | POA: Diagnosis not present

## 2023-08-06 ENCOUNTER — Encounter (INDEPENDENT_AMBULATORY_CARE_PROVIDER_SITE_OTHER): Payer: Self-pay

## 2023-08-06 DIAGNOSIS — H35372 Puckering of macula, left eye: Secondary | ICD-10-CM | POA: Diagnosis not present

## 2023-08-06 DIAGNOSIS — H0288B Meibomian gland dysfunction left eye, upper and lower eyelids: Secondary | ICD-10-CM | POA: Diagnosis not present

## 2023-08-06 DIAGNOSIS — H524 Presbyopia: Secondary | ICD-10-CM | POA: Diagnosis not present

## 2023-08-06 DIAGNOSIS — H52213 Irregular astigmatism, bilateral: Secondary | ICD-10-CM | POA: Diagnosis not present

## 2023-08-06 DIAGNOSIS — H0288A Meibomian gland dysfunction right eye, upper and lower eyelids: Secondary | ICD-10-CM | POA: Diagnosis not present

## 2023-08-06 DIAGNOSIS — H5203 Hypermetropia, bilateral: Secondary | ICD-10-CM | POA: Diagnosis not present

## 2023-09-13 NOTE — Patient Instructions (Incomplete)
Ms. Dobish , Thank you for taking time to come for your Medicare Wellness Visit. I appreciate your ongoing commitment to your health goals. Please review the following plan we discussed and let me know if I can assist you in the future.   Referrals/Orders/Follow-Ups/Clinician Recommendations: Aim for 30 minutes of exercise or brisk walking, 6-8 glasses of water, and 5 servings of fruits and vegetables each day.  This is a list of the screening recommended for you and due dates:  Health Maintenance  Topic Date Due   Zoster (Shingles) Vaccine (1 of 2) 03/30/1973   Colon Cancer Screening  12/22/2022   Flu Shot  07/23/2023   Medicare Annual Wellness Visit  09/11/2023   COVID-19 Vaccine (3 - Moderna risk series) 05/05/2024*   Stool Blood Test  08/11/2024   Mammogram  10/16/2024   DTaP/Tdap/Td vaccine (2 - Td or Tdap) 08/21/2029   Pneumonia Vaccine  Completed   DEXA scan (bone density measurement)  Completed   HPV Vaccine  Aged Out   Hepatitis C Screening  Discontinued  *Topic was postponed. The date shown is not the original due date.    Advanced directives: (ACP Link)Information on Advanced Care Planning can be found at [facility] [place] Advance Health Care Directives Advance Health Care Directives ([email or web address]   Next Medicare Annual Wellness Visit scheduled for next year: Yes

## 2023-09-14 ENCOUNTER — Ambulatory Visit (INDEPENDENT_AMBULATORY_CARE_PROVIDER_SITE_OTHER): Payer: Medicare Other

## 2023-09-14 VITALS — Ht 67.0 in | Wt 185.0 lb

## 2023-09-14 DIAGNOSIS — Z0000 Encounter for general adult medical examination without abnormal findings: Secondary | ICD-10-CM

## 2023-09-14 NOTE — Progress Notes (Signed)
Subjective:   Misty Walker is a 67 y.o. female who presents for Medicare Annual (Subsequent) preventive examination.  Visit Complete: Virtual  I connected with  Ivor Messier on 09/14/23 by a audio enabled telemedicine application and verified that I am speaking with the correct person using two identifiers.  Patient Location: Home  Provider Location: Home Office  I discussed the limitations of evaluation and management by telemedicine. The patient expressed understanding and agreed to proceed.  Patient Medicare AWV questionnaire was completed by the patient on 09/08/23; I have confirmed that all information answered by patient is correct and no changes since this date.  Cardiac Risk Factors include: advanced age (>75men, >24 women);dyslipidemia     Objective:    Today's Vitals   09/14/23 0957  Weight: 185 lb (83.9 kg)  Height: 5\' 7"  (1.702 m)   Body mass index is 28.98 kg/m.     09/14/2023   10:03 AM 09/10/2022   10:00 AM 09/03/2021   11:15 AM  Advanced Directives  Does Patient Have a Medical Advance Directive? No Yes Yes  Type of Special educational needs teacher of [place];Living will Healthcare Power of [place];Living will  Does patient want to make changes to medical advance directive?  Yes (Inpatient - patient defers changing a medical advance directive and declines information at this time) No - Patient declined  Copy of Healthcare Power of Attorney in Chart?  No - copy requested No - copy requested  Would patient like information on creating a medical advance directive? Yes (MAU/Ambulatory/Procedural Areas - Information given)      Current Medications (verified) Outpatient Encounter Medications as of 09/14/2023  Medication Sig   atenolol (TENORMIN) 25 MG tablet TAKE 1 TABLET BY MOUTH DAILY   FLUoxetine (PROZAC) 20 MG capsule TAKE 1 CAPSULE BY MOUTH DAILY   Krill Oil 350 MG CAPS Take by mouth daily.   Multiple Vitamin (MULTIVITAMIN ADULT PO) Take by mouth.    rosuvastatin (CRESTOR) 5 MG tablet TAKE 1 TABLET BY MOUTH DAILY   Vitamin D-Vitamin K (VITAMIN K2-VITAMIN D3 PO) Take by mouth.   [DISCONTINUED] amoxicillin-clavulanate (AUGMENTIN) 875-125 MG tablet Take 1 tablet by mouth 2 (two) times daily.   No facility-administered encounter medications on file as of 09/14/2023.    Allergies (verified) Patient has no known allergies.   History: Past Medical History:  Diagnosis Date   Hyperlipidemia    Past Surgical History:  Procedure Laterality Date   NO PAST SURGERIES     Family History  Problem Relation Age of Onset   Hypertension Mother    Breast cancer Neg Hx    Social History   Socioeconomic History   Marital status: Married    Spouse name: Not on file   Number of children: 1   Years of education: Not on file   Highest education level: Not on file  Occupational History   Occupation: retired  Tobacco Use   Smoking status: Never   Smokeless tobacco: Never  Vaping Use   Vaping status: Never Used  Substance and Sexual Activity   Alcohol use: Yes    Alcohol/week: 3.0 standard drinks of alcohol    Types: 3 Glasses of wine per week    Comment: glass of wine three times week   Drug use: Never   Sexual activity: Yes    Partners: Male    Birth control/protection: Post-menopausal  Other Topics Concern   Not on file  Social History Narrative   Not on file   Social  Determinants of Health   Financial Resource Strain: Low Risk  (09/08/2023)   Overall Financial Resource Strain (CARDIA)    Difficulty of Paying Living Expenses: Not hard at all  Food Insecurity: No Food Insecurity (09/08/2023)   Hunger Vital Sign    Worried About Running Out of Food in the Last Year: Never true    Ran Out of Food in the Last Year: Never true  Transportation Needs: No Transportation Needs (09/08/2023)   PRAPARE - Administrator, Civil Service (Medical): No    Lack of Transportation (Non-Medical): No  Physical Activity: Sufficiently  Active (09/08/2023)   Exercise Vital Sign    Days of Exercise per Week: 6 days    Minutes of Exercise per Session: 40 min  Stress: No Stress Concern Present (09/08/2023)   [employer] of Occupational Health - Occupational Stress Questionnaire    Feeling of Stress : Only a little  Social Connections: Socially Integrated (09/08/2023)   Social Connection and Isolation Panel [NHANES]    Frequency of Communication with Friends and Family: Twice a week    Frequency of Social Gatherings with Friends and Family: Once a week    Attends Religious Services: More than 4 times per year    Active Member of [employer] or Organizations: Yes    Attends Engineer, structural: More than 4 times per year    Marital Status: Married    Tobacco Counseling Counseling given: Not Answered   Clinical Intake:  Pre-visit preparation completed: Yes  Pain : No/denies pain  Diabetes: No  How often do you need to have someone help you when you read instructions, pamphlets, or other written materials from your doctor or pharmacy?: 1 - Never  Interpreter Needed?: No  Information entered by :: Kandis Fantasia LPN   Activities of Daily Living    09/08/2023    6:05 AM  In your present state of health, do you have any difficulty performing the following activities:  Hearing? 0  Vision? 0  Difficulty concentrating or making decisions? 0  Walking or climbing stairs? 0  Dressing or bathing? 0  Doing errands, shopping? 0  Preparing Food and eating ? N  Using the Toilet? N  In the past six months, have you accidently leaked urine? N  Do you have problems with loss of bowel control? N  Managing your Medications? N  Managing your Finances? N  Housekeeping or managing your Housekeeping? N    Patient Care Team: Mort Sawyers, FNP as PCP - General (Family Medicine) Debbe Odea, MD as Consulting Physician (Cardiology)  Indicate any recent Medical Services you may have received from other than  [facility] providers in the past year (date may be approximate).     Assessment:   This is a routine wellness examination for [facility].  Hearing/Vision screen Hearing Screening - Comments:: Denies hearing difficulties   Vision Screening - Comments:: Wears rx glasses - up to date with routine eye exams with [facility]     Goals Addressed   None   Depression Screen    09/14/2023   10:00 AM 05/06/2023    9:00 AM 09/10/2022    9:59 AM 09/10/2022    9:58 AM 03/20/2022    9:15 AM 09/03/2021   10:04 AM 05/21/2021    3:30 PM  PHQ 2/9 Scores  PHQ - 2 Score 0  1 0  0 3  PHQ- 9 Score   3 0   5  Exception Documentation  Patient  refusal   --      Fall Risk    09/08/2023    6:05 AM 09/10/2022   10:01 AM 09/03/2021   10:03 AM  Fall Risk   Falls in the past year? 0 0 0  Number falls in past yr: 0 0   Injury with Fall? 0 0 0  Risk for fall due to :  No Fall Risks   Follow up Falls evaluation completed;Education provided;Falls prevention discussed Falls prevention discussed Falls evaluation completed    MEDICARE RISK AT HOME: Medicare Risk at Home Any stairs in or around the home?: Yes If so, are there any without handrails?: Yes Home free of loose throw rugs in walkways, pet beds, electrical cords, etc?: Yes Adequate lighting in your home to reduce risk of falls?: Yes Life alert?: No Use of a cane, walker or w/c?: No Grab bars in the bathroom?: No Shower chair or bench in shower?: Yes Elevated toilet seat or a handicapped toilet?: Yes  TIMED UP AND GO:  Was the test performed?  No    Cognitive Function:        09/14/2023   10:03 AM 09/10/2022   10:02 AM  6CIT Screen  What Year? 0 points 0 points  What month? 0 points 0 points  What time? 0 points 0 points  Count back from 20 0 points 0 points  Months in reverse 0 points 0 points  Repeat phrase 0 points 0 points  Total Score 0 points 0 points    Immunizations Immunization History  Administered Date(s) Administered    Fluad Quad(high Dose 65+) 09/17/2017, 09/24/2018, 09/05/2019, 09/03/2021, 09/25/2022   Hepatitis A 05/02/2015, 11/07/2015   Influenza Nasal 09/28/2014   Influenza Split 11/29/2011   Influenza, High Dose Seasonal PF 10/17/2013   Influenza-Unspecified 09/27/2012, 09/18/2015, 08/29/2020   Moderna SARS-COV2 Booster Vaccination 11/30/2020   Moderna Sars-Covid-2 Vaccination 02/11/2020, 03/10/2020   Pneumococcal Conjugate-13 10/15/2017   Pneumococcal Polysaccharide-23 08/22/2019   Tdap 08/22/2019   Zoster, Live 04/13/2014    TDAP status: Up to date  Flu Vaccine status: Due, Education has been provided regarding the importance of this vaccine. Advised may receive this vaccine at local pharmacy or Health Dept. Aware to provide a copy of the vaccination record if obtained from local pharmacy or Health Dept. Verbalized acceptance and understanding.  Pneumococcal vaccine status: Up to date  Covid-19 vaccine status: Information provided on how to obtain vaccines.   Qualifies for Shingles Vaccine? Yes   Zostavax completed Yes   Shingrix Completed?: No.    Education has been provided regarding the importance of this vaccine. Patient has been advised to call insurance company to determine out of pocket expense if they have not yet received this vaccine. Advised may also receive vaccine at local pharmacy or Health Dept. Verbalized acceptance and understanding.  Screening Tests Health Maintenance  Topic Date Due   Zoster Vaccines- Shingrix (1 of 2) 03/30/1973   Colonoscopy  12/22/2022   INFLUENZA VACCINE  07/23/2023   COVID-19 Vaccine (3 - Moderna risk series) 05/05/2024 (Originally 12/28/2020)   COLON CANCER SCREENING ANNUAL FOBT  08/11/2024   Medicare Annual Wellness (AWV)  09/13/2024   MAMMOGRAM  10/16/2024   DTaP/Tdap/Td (2 - Td or Tdap) 08/21/2029   Pneumonia Vaccine 65+ Years old  Completed   DEXA SCAN  Completed   HPV VACCINES  Aged Out   Hepatitis C Screening  Discontinued    Health  Maintenance  Health Maintenance Due  Topic Date Due  Zoster Vaccines- Shingrix (1 of 2) 03/30/1973   Colonoscopy  12/22/2022   INFLUENZA VACCINE  07/23/2023    Colorectal cancer screening:  Patient declines at this time; has completed yearly test from insurance   Mammogram status: Completed 10/16/22. Repeat every year  Bone Density status: Completed 08/27/21. Results reflect: Bone density results: OSTEOPENIA. Repeat every 2 years.  Lung Cancer Screening: (Low Dose CT Chest recommended if Age 19-80 years, 20 pack-year currently smoking OR have quit w/in 15years.) does not qualify.   Lung Cancer Screening Referral: n/a  Additional Screening:  Hepatitis C Screening: does qualify  Vision Screening: Recommended annual ophthalmology exams for early detection of glaucoma and other disorders of the eye. Is the patient up to date with their annual eye exam?  Yes  Who is the provider or what is the name of the office in which the patient attends annual eye exams? [facility] If pt is not established with a provider, would they like to be referred to a provider to establish care? No .   Dental Screening: Recommended annual dental exams for proper oral hygiene  Community Resource Referral / Chronic Care Management: CRR required this visit?  No   CCM required this visit?  No     Plan:     I have personally reviewed and noted the following in the patient's chart:   Medical and social history Use of alcohol, tobacco or illicit drugs  Current medications and supplements including opioid prescriptions. Patient is not currently taking opioid prescriptions. Functional ability and status Nutritional status Physical activity Advanced directives List of other physicians Hospitalizations, surgeries, and ER visits in previous 12 months Vitals Screenings to include cognitive, depression, and falls Referrals and appointments  In addition, I have reviewed and discussed with  patient certain preventive protocols, quality metrics, and best practice recommendations. A written personalized care plan for preventive services as well as general preventive health recommendations were provided to patient.     Kandis Fantasia [place], [place]   1/61/0960   After Visit Summary: (MyChart) Due to this being a telephonic visit, the after visit summary with patients personalized plan was offered to patient via MyChart   Nurse Notes: No concerns at this time;  patient scheduled for annual physical

## 2023-11-30 ENCOUNTER — Other Ambulatory Visit: Payer: Self-pay | Admitting: Family

## 2023-11-30 DIAGNOSIS — Z1231 Encounter for screening mammogram for malignant neoplasm of breast: Secondary | ICD-10-CM

## 2023-12-04 ENCOUNTER — Encounter: Payer: Self-pay | Admitting: Family

## 2023-12-04 ENCOUNTER — Ambulatory Visit (INDEPENDENT_AMBULATORY_CARE_PROVIDER_SITE_OTHER): Payer: Medicare Other | Admitting: Family

## 2023-12-04 VITALS — BP 132/80 | HR 58 | Temp 98.2°F | Ht 67.0 in | Wt 185.0 lb

## 2023-12-04 DIAGNOSIS — H35372 Puckering of macula, left eye: Secondary | ICD-10-CM | POA: Diagnosis not present

## 2023-12-04 DIAGNOSIS — Z1211 Encounter for screening for malignant neoplasm of colon: Secondary | ICD-10-CM | POA: Diagnosis not present

## 2023-12-04 DIAGNOSIS — M8580 Other specified disorders of bone density and structure, unspecified site: Secondary | ICD-10-CM | POA: Diagnosis not present

## 2023-12-04 DIAGNOSIS — E782 Mixed hyperlipidemia: Secondary | ICD-10-CM

## 2023-12-04 DIAGNOSIS — Z780 Asymptomatic menopausal state: Secondary | ICD-10-CM

## 2023-12-04 DIAGNOSIS — F339 Major depressive disorder, recurrent, unspecified: Secondary | ICD-10-CM

## 2023-12-04 DIAGNOSIS — Z136 Encounter for screening for cardiovascular disorders: Secondary | ICD-10-CM | POA: Diagnosis not present

## 2023-12-04 DIAGNOSIS — Z0000 Encounter for general adult medical examination without abnormal findings: Secondary | ICD-10-CM

## 2023-12-04 DIAGNOSIS — E538 Deficiency of other specified B group vitamins: Secondary | ICD-10-CM | POA: Insufficient documentation

## 2023-12-04 LAB — CBC
HCT: 43.9 % (ref 36.0–46.0)
Hemoglobin: 14.7 g/dL (ref 12.0–15.0)
MCHC: 33.5 g/dL (ref 30.0–36.0)
MCV: 96 fL (ref 78.0–100.0)
Platelets: 342 10*3/uL (ref 150.0–400.0)
RBC: 4.58 Mil/uL (ref 3.87–5.11)
RDW: 12.5 % (ref 11.5–15.5)
WBC: 9.1 10*3/uL (ref 4.0–10.5)

## 2023-12-04 LAB — LIPID PANEL
Cholesterol: 163 mg/dL (ref 0–200)
HDL: 66 mg/dL (ref >39.00)
LDL Cholesterol: 78 mg/dL (ref 0–99)
NonHDL: 96.75
Total CHOL/HDL Ratio: 2
Triglycerides: 95 mg/dL (ref 0.0–149.0)
VLDL: 19 mg/dL (ref 0.0–40.0)

## 2023-12-04 LAB — BASIC METABOLIC PANEL
BUN: 15 mg/dL (ref 6–23)
CO2: 30 meq/L (ref 19–32)
Calcium: 9.6 mg/dL (ref 8.4–10.5)
Chloride: 101 meq/L (ref 96–112)
Creatinine, Ser: 0.78 mg/dL (ref 0.40–1.20)
GFR: 77.36 mL/min (ref >60.00)
Glucose, Bld: 84 mg/dL (ref 70–99)
Potassium: 4.4 meq/L (ref 3.5–5.1)
Sodium: 140 meq/L (ref 135–145)

## 2023-12-04 LAB — VITAMIN B12: Vitamin B-12: 414 pg/mL (ref 211–911)

## 2023-12-04 NOTE — Progress Notes (Signed)
Subjective:  Patient ID: Misty Walker, female    DOB: Dec 31, 1955  Age: 67 y.o. MRN: [id number]  Patient Care Team: Mort Sawyers, FNP as PCP - General (Family Medicine) Debbe Odea, MD as Consulting Physician (Cardiology)   CC:  Chief Complaint  Patient presents with   Annual Exam    HPI Misty Walker is a 67 y.o. female who presents today for an annual physical exam. She reports consuming a general diet. Home exercise routine includes walking 1 hrs per day. She generally feels well. She reports sleeping well. She does not have additional problems to discuss today.   Vision:Within last year Dental:Receives regular dental care  Mammogram: scheduled for 12/24/23 Last pap: > 25 y/o  Colonoscopy: cologuard of which has been completed, she stated she completed  Bone density scan: 08/27/21, not taking calcium at this time.   Pt is without acute concerns.   HLD: on rosuvastatin 5 mg nightly tolerating well.   Depression, on prozac 20 mg once daily, struggling a bit with depression, states at this time she wants to look into counseling. She wants to stay at the dose at current. Mainly homebody. Husband with heal problems which keeps  Close to home.   Shingles vaccination: declines today will set up appt at pharmacy   Advanced Directives Patient does not have advanced directives. She does not have a copy in the electronic medical record.   DEPRESSION SCREENING    12/04/2023    9:46 AM 09/14/2023   10:00 AM 05/06/2023    9:00 AM 09/10/2022    9:59 AM 09/10/2022    9:58 AM 03/20/2022    9:15 AM 09/03/2021   10:04 AM  PHQ 2/9 Scores  PHQ - 2 Score 3 0  1 0  0  PHQ- 9 Score 5   3 0    Exception Documentation   Patient refusal   --      ROS: Negative unless specifically indicated above in HPI.    Current Outpatient Medications:    atenolol (TENORMIN) 25 MG tablet, TAKE 1 TABLET BY MOUTH DAILY, Disp: 100 tablet, Rfl: 2   FLUoxetine (PROZAC) 20 MG capsule, TAKE 1 CAPSULE BY MOUTH  DAILY, Disp: 90 capsule, Rfl: 3   Krill Oil 350 MG CAPS, Take by mouth daily., Disp: , Rfl:    Multiple Vitamin (MULTIVITAMIN ADULT PO), Take by mouth., Disp: , Rfl:    rosuvastatin (CRESTOR) 5 MG tablet, TAKE 1 TABLET BY MOUTH DAILY, Disp: 100 tablet, Rfl: 2   Vitamin D-Vitamin K (VITAMIN K2-VITAMIN D3 PO), Take by mouth., Disp: , Rfl:     Objective:    BP 132/80 (BP Location: Left Arm, Patient Position: Sitting, Cuff Size: Normal)   Pulse (!) 58   Temp 98.2 F (36.8 C) (Temporal)   Ht 5\' 7"  (1.702 m)   Wt 185 lb (83.9 kg)   SpO2 97%   BMI 28.98 kg/m   BP Readings from Last 3 Encounters:  12/04/23 132/80  05/06/23 124/74  08/04/22 128/76      Physical Exam Constitutional:      General: She is not in acute distress.    Appearance: Normal appearance. She is normal weight. She is not ill-appearing.  HENT:     Head: Normocephalic.     Right Ear: Tympanic membrane normal.     Left Ear: Tympanic membrane normal.     Nose: Nose normal.     Mouth/Throat:     Mouth: Mucous membranes are moist.  Eyes:  Extraocular Movements: Extraocular movements intact.     Pupils: Pupils are equal, round, and reactive to light.  Cardiovascular:     Rate and Rhythm: Normal rate and regular rhythm.  Pulmonary:     Effort: Pulmonary effort is normal.     Breath sounds: Normal breath sounds.  Abdominal:     General: Abdomen is flat. Bowel sounds are normal.     Palpations: Abdomen is soft.     Tenderness: There is no guarding or rebound.  Musculoskeletal:        General: Normal range of motion.     Cervical back: Normal range of motion.  Skin:    General: Skin is warm.     Capillary Refill: Capillary refill takes less than 2 seconds.  Neurological:     General: No focal deficit present.     Mental Status: She is alert.  Psychiatric:        Mood and Affect: Mood normal.        Behavior: Behavior normal.        Thought Content: Thought content normal.        Judgment: Judgment  normal.          Assessment & Plan:  Encounter for general adult medical examination without abnormal findings Assessment & Plan: Patient Counseling(The following topics were reviewed):  Preventative care handout given to pt  Health maintenance and immunizations reviewed. Please refer to Health maintenance section. Pt advised on safe sex, wearing seatbelts in car, and proper nutrition labwork ordered today for annual Dental health: Discussed importance of regular tooth brushing, flossing, and dental visits.   Orders: -     Lipid panel -     Basic metabolic panel -     CBC  Depression, recurrent (HCC) Assessment & Plan: Not much improving however pt declines any change in medication prozac at this time but is open to psychology so referral has been placed.    Orders: -     Ambulatory referral to Psychology  Osteopenia, unspecified location Assessment & Plan: Recommend daily vitamin D and calcium  Work on weight bearing exercises  bone dexa ordered pending results    Orders: -     DG Bone Density; Future  Postmenopausal -     DG Bone Density; Future  Screening for colon cancer -     Cologuard  Macular pucker, left eye  Low serum vitamin B12 Assessment & Plan: Ordering b12 pending results  Orders: -     Vitamin B12  Screening for cardiovascular condition -     Lipid panel  Mixed hyperlipidemia Assessment & Plan: Ordered lipid panel, pending results. Work on low cholesterol diet and exercise as tolerated Continue rosuvastatin 5 mg        Follow-up: Return in about 6 months (around 06/03/2024) for f/u depression.   Mort Sawyers, FNP

## 2023-12-04 NOTE — Assessment & Plan Note (Signed)

## 2023-12-04 NOTE — Patient Instructions (Addendum)
------------------------------------    Call to see if there is therapy available.  [facility]  tel: [phone]  ------------------------------------  A referral was placed today for psychology  Please let us know if you have not heard back within 2 weeks about the referral.  ------------------------------------ You have osteopenia (low bone mass), which can progress to osteoporosis.  Please take over the counter Calcium 1,200 mg and Vitamin  D3 800 IU once daily to protect your bones. Doing weightbearing exercise like walking, jogging, dancing, etc. is good for you bones.   ------------------------------------  I have sent an electronic order over to your preferred location for the following:   []   2D Mammogram  []   3D Mammogram  [x]   Bone Density   Please give this center a call to get scheduled at your convenience.  [x]   [facility]  [address] [place] [address]  [phone]   ------------------------------------  Consider shingles vaccination (two doses)

## 2023-12-04 NOTE — Assessment & Plan Note (Signed)
Not much improving however pt declines any change in medication prozac at this time but is open to psychology so referral has been placed.

## 2023-12-04 NOTE — Assessment & Plan Note (Signed)
Recommend daily vitamin D and calcium  Work on weight bearing exercises  bone dexa ordered pending results

## 2023-12-04 NOTE — Assessment & Plan Note (Signed)
Ordering b12 pending results 

## 2023-12-04 NOTE — Assessment & Plan Note (Signed)
Ordered lipid panel, pending results. Work on low cholesterol diet and exercise as tolerated Continue rosuvastatin 5 mg  

## 2023-12-11 DIAGNOSIS — Z1211 Encounter for screening for malignant neoplasm of colon: Secondary | ICD-10-CM | POA: Diagnosis not present

## 2023-12-15 ENCOUNTER — Other Ambulatory Visit: Payer: Self-pay | Admitting: Family

## 2023-12-15 DIAGNOSIS — E785 Hyperlipidemia, unspecified: Secondary | ICD-10-CM

## 2023-12-21 LAB — COLOGUARD: COLOGUARD: NEGATIVE

## 2023-12-24 ENCOUNTER — Ambulatory Visit
Admission: RE | Admit: 2023-12-24 | Discharge: 2023-12-24 | Disposition: A | Discharge status: Home / Self Care | Payer: Medicare Other | Source: Ambulatory Visit | Attending: Family | Admitting: Family

## 2023-12-24 DIAGNOSIS — Z1231 Encounter for screening mammogram for malignant neoplasm of breast: Secondary | ICD-10-CM | POA: Diagnosis not present

## 2023-12-29 ENCOUNTER — Ambulatory Visit: Payer: Medicare Other | Admitting: Clinical

## 2023-12-29 DIAGNOSIS — F331 Major depressive disorder, recurrent, moderate: Secondary | ICD-10-CM | POA: Diagnosis not present

## 2023-12-29 NOTE — Progress Notes (Signed)
 [facility] Counselor Initial Adult Exam  Name: Analiese Krupka Date: 12/29/2023 MRN: [id number] DOB: 1956/10/12 PCP: Corwin Antu, FNP  Time spent: 9:35am - 10:14am   Guardian/Payee:  NA    Paperwork requested:  NA  Reason for Visit /Presenting Problem: Patient stated, I'm just kind of a mess. Patient stated, we (patient/husband) don't like it here. Patient reported patient/husband moved to [place]  from [place]  3 years ago to be near their daughter. Patient stated, she (daughter) hasn't been very welcoming. Patient reported her husband has developed several medical issues and stated, I can't keep being his strength when I'm so empty.   Mental Status Exam: Appearance:   Neat and Well Groomed     Behavior:  Appropriate  Motor:  Normal  Speech/Language:   Clear and Coherent  Affect:  Tearful  Mood:  Patient stated, I don't feel anything, obviously.   Thought process:  normal  Thought content:    WNL  Sensory/Perceptual disturbances:    WNL  Orientation:  oriented to person, place, time/date, situation, and day of week  Attention:  Good  Concentration:  Good  Memory:  WNL  Fund of knowledge:   Good  Insight:    Good  Judgment:   Good  Impulse Control:  Good    Reported Symptoms:  Patient stated, if I can keep from crying its a good mood, loss of interest, sadness, depressed mood, history of depressive symptoms since age 68, recent weight gain, decreased energy, loss of motivation, boredom. Patient reported she has been taking an antidepressant medication for approximately 10 -15 years. Patient stated, this is just a steady state in regard to current symptoms and reported symptoms started approximately 2 years ago and stated, when we realized we had made a mistake.   Risk Assessment: Danger to Self:  No Patient denied current suicidal ideation. Patient stated, just wish I wasn't here but denied plan or intent to harm herself. Patient denied  current and past symptoms of psychosis.  Self-injurious Behavior: No Danger to Others: No Patient denied current and past homicidal ideation Duty to Warn:no Physical Aggression / Violence:No  Access to Firearms a concern: No  Gang Involvement:No  Patient / guardian was educated about steps to take if suicide or homicide risk level increases between visits: yes While future psychiatric events cannot be accurately predicted, the patient does not currently require acute inpatient psychiatric care and does not currently meet [place]  involuntary commitment criteria.  Substance Abuse History: Current substance abuse: No   Patient reported an occasional glass of wine. Patient denied current and past tobacco or drug use.   Past Psychiatric History:   Previous psychological history is significant for depression Outpatient Providers: none History of Psych Hospitalization: No  Psychological Testing:  none    Abuse History:  Victim of: No.,  none    Report needed: No. Victim of Neglect:No. Perpetrator of  none   Witness / Exposure to Domestic Violence: No   Protective Services Involvement: No  Witness to [employer] Violence:  No   Family History:  Family History  Problem Relation Age of Onset   Hypertension Mother    Breast cancer Neg Hx     Living situation: the patient lives with their spouse  Sexual Orientation: Straight  Relationship Status: married 49 years Name of spouse / other: Nancyann If a parent, number of children / ages: 1 daughter age 73  Support Systems: friends Patient stated, but I feel like I don't really have a  support system.   Financial Stress:  No   Income/Employment/Disability: Neurosurgeon: No   Educational History: Education:  2 years of college  Religion/Sprituality/World View: Patient stated, we are very strong Christians  Any cultural differences that may affect / interfere with treatment:  not  applicable   Recreation/Hobbies: walking   Stressors: Other: relationship with daughter, husband's health    Strengths: Friends and Church  Barriers:  husband's health and stated, I feel very much on my own   Legal History: Pending legal issue / charges: The patient has no significant history of legal issues. History of legal issue / charges:  none  Medical History/Surgical History: reviewed Past Medical History:  Diagnosis Date   Hyperlipidemia     Past Surgical History:  Procedure Laterality Date   NO PAST SURGERIES      Medications: Current Outpatient Medications  Medication Sig Dispense Refill   atenolol  (TENORMIN ) 25 MG tablet TAKE 1 TABLET BY MOUTH DAILY 100 tablet 2   FLUoxetine  (PROZAC ) 20 MG capsule TAKE 1 CAPSULE BY MOUTH DAILY 90 capsule 3   Krill Oil 350 MG CAPS Take by mouth daily.     Multiple Vitamin (MULTIVITAMIN ADULT PO) Take by mouth.     rosuvastatin  (CRESTOR ) 5 MG tablet TAKE 1 TABLET BY MOUTH DAILY 100 tablet 1   Vitamin D -Vitamin K (VITAMIN K2-VITAMIN D3 PO) Take by mouth.     No current facility-administered medications for this visit.  Per patient reported 12/29/23 patient no longer takes Vitamin D -Vitamin K and is currently taking vitamin B12  No Known Allergies  Diagnoses:  Major depressive disorder, recurrent episode, moderate (HCC)  Plan of Care: Patient is a 68 year old female who presented for an initial assessment. Clinician conducted initial assessment in person from clinician's office at [facility]. Patient reported the following symptoms: crying, loss of interest, sadness, depressed mood,  recent weight gain, decreased energy, loss of motivation, boredom. Patient reported a history of depressive symptoms since age 68 and reported she has been taking an antidepressant medication for approximately 10 -15 years. Patient stated, this is just a steady state in regard to current symptoms and reported symptoms started approximately  2 years ago. Patient denied current suicidal ideation. Patient stated, just wish I wasn't here but denied plan or intent to harm herself. Patient denied current and past homicidal ideation and symptoms of psychosis. Patient reported drinking an occasional glass of wine. Patient denied current and past tobacco or drug use. Patient reported no history of outpatient or inpatient psychiatric treatment. Patient reported the relationship with her daughter and husband's health are current stressors. Patient identified patient's friends in [place]  as current supports. It is recommended patient be referred to a psychiatrist for a medication management consult and recommended patient participate in individual therapy weekly. Information was disclosed that brought to light a conflict that necessitates a referral to another clinician. Clinician will initiate the necessary referral.   Collaboration of Care: Other Patient declined to complete consents at this time.   Patient/Guardian was advised Release of Information must be obtained prior to any record release in order to collaborate their care with an outside provider. Patient/Guardian was advised if they have not already done so to contact [facility] to sign all necessary forms in order for us  to release information regarding their care.   Darice Seats, LCSW

## 2023-12-29 NOTE — Progress Notes (Signed)
   Doree Barthel, LCSW

## 2024-01-06 ENCOUNTER — Ambulatory Visit (INDEPENDENT_AMBULATORY_CARE_PROVIDER_SITE_OTHER): Payer: Medicare Other | Admitting: Psychology

## 2024-01-06 ENCOUNTER — Ambulatory Visit: Payer: Medicare Other | Admitting: Clinical

## 2024-01-06 DIAGNOSIS — F331 Major depressive disorder, recurrent, moderate: Secondary | ICD-10-CM

## 2024-01-06 DIAGNOSIS — F32A Depression, unspecified: Secondary | ICD-10-CM

## 2024-01-06 NOTE — Progress Notes (Signed)
Behavioral Health Counselor/Therapist Progress Note  Patient ID: Misty Walker, MRN: [id number]   Date: 01/06/24  Time Spent: 3:00 p.m. - 3:59 p.m. 59 Minutes  Treatment Type: Individual Therapy.  Reported Symptoms: Pt reported that she feels like a failure as a grandmother, has trouble concentrating on things, being fidgety, feeling nervous, and worrying about things.   Mental Status Exam: Appearance:  Well Groomed     Behavior: Appropriate  Motor: Normal  Speech/Language:  Normal Rate  Affect: Appropriate  Mood: normal  Thought process: normal  Thought content:   WNL  Sensory/Perceptual disturbances:   WNL  Orientation: oriented to  person, place, date, time  Attention: Good  Concentration: Good  Memory: WNL  Fund of knowledge:  Good  Insight:   Good  Judgment:  Good  Impulse Control: Good   Risk Assessment: Danger to Self:   No Self-injurious Behavior: No Danger to Others: No Duty to Warn: No Physical Aggression / Violence:No  Access to Firearms a concern: No  Gang Involvement:Yes   Subjective:   Ivor Messier was referred to this Clinical research associate by a previous therapist due to a conflict that necessitated a referral to a different therapist. Celise participated from home, via video and consented to treatment. Therapist participated from office. I discussed the limitations of evaluation and management by telemedicine and the availability of in person appointments. The patient expressed understanding and agreed to proceed. Dondra Spry reviewed the events of the past week.   Pt continued to question why her daughter did not want the pt and her husband, Roe Coombs, to spend time in their home with pt's daughter, son in law, and two young granddaughters, on special occasions (granddaughter's birthdays, Christmas, etc.) On the specific day of the occasion, pt's understanding was that he daughter wanted it to be "just the four of them, their unit." Pt feels trapped because since she and her husband  picked up their lives and moved across the country three years ago, her husband's health has declined, and she hasn't made new friends, nor participated in her favorite pastimes.   We reviewed numerous treatment approaches including CBT, BA, Problem Solving, and Solution focused therapy. Psych-education regarding the Cresencia's diagnosis of  depression was provided during the session. We discussed Dondra Spry Corral's treatment goals which include setting goals, returning to activities she had enjoyed in the past (hiking, etc.) initiating new friendships, increasing her communication with her daughter and son in law, and developing a support system outside of her family. An additional goal was to engage in consistent self-care. Dondra Spry [place] provided verbal approval of the treatment plan.   Interventions: Psycho-education & Goal Setting.   Diagnosis:  Depression  Psychiatric Treatment: No    Treatment Plan:  Client Abilities/Strengths Samona is intelligent, caring, and motivated to develop  a new support system.   Support System: Husband and friends in [place]  Client Treatment Preferences Outpatient psychotherapy  Client Statement of Needs Felesia would like to set goals, develop a healthy support system, increase social interaction, and develop a more connected relationship with her daughter and sister in law.    Treatment Level Weekly/Biweekly  Symptoms  Depression: Decreased interest in things, feeling depressed, feeling like a failure.   (Status: maintained) No local support system: No friends she can count on in [place], to do things with for enjoyment.  (Status: maintained)  Goals: Lorrine Kin experiences symptoms of Depression.  Treatment plan signed and available on s-drive:  No, pending signature.   Target Date:  02/05/25 Frequency: Weekly/Biweekly  Progress: 0 Modality: individual    Therapist will provide referrals for additional resources as appropriate.   Therapist will provide psycho-education regarding Hollan's diagnosis and corresponding treatment approaches and interventions. Helyn Numbers will support the patient's ability to achieve the goals identified. will employ CBT, BA, Problem-solving, Solution Focused, Mindfulness,  coping skills, & other evidenced-based practices will be used to promote progress towards healthy functioning to help manage decrease symptoms associated with their diagnosis.   Reduce overall level, frequency, and intensity of the feelings of depression, anxiety and panic evidenced by decreased overall symptoms from 6 to 7 days/week to 0 to 1 days/week per client report for at least 3 consecutive months. Verbally express understanding of the relationship between feelings of depression, and their impact on thinking patterns and behaviors. Verbalize an understanding of the role that distorted thinking plays in creating fears, excessive worry, and ruminations.    Dondra Spry participated in the creation of the treatment plan)    Helyn Numbers

## 2024-01-13 ENCOUNTER — Ambulatory Visit: Payer: Medicare Other | Admitting: Psychology

## 2024-01-13 DIAGNOSIS — F331 Major depressive disorder, recurrent, moderate: Secondary | ICD-10-CM | POA: Diagnosis not present

## 2024-01-13 NOTE — Progress Notes (Signed)
 [facility] Counselor/Therapist Progress Note  Patient ID: Misty Walker, MRN: [id number]    Date: 01/13/24  Time Spent: 2:00 pm - 2:50 pm: 50 minutes  Treatment Type: Individual Therapy.  Reported Symptoms: Pt reported that she feels like a failure as a grandmother, has trouble concentrating on things, being fidgety, feeling nervous, and worrying about things.   Mental Status Exam: Appearance:  Well Groomed     Behavior: Appropriate  Motor: Normal  Speech/Language:  Normal Rate  Affect: Appropriate  Mood: normal  Thought process: normal  Thought content:   WNL  Sensory/Perceptual disturbances:   WNL  Orientation: Person, place, date, time  Attention: Good  Concentration: Good  Memory: WNL  Fund of knowledge:  Good  Insight:   Good  Judgment:  Good  Impulse Control: Good   Risk Assessment: Danger to Self:  No Self-injurious Behavior: No Danger to Others: No Duty to Warn:no Physical Aggression / Violence:No  Access to Firearms a concern: No  Gang Involvement:No   Subjective:   Ivor Messier participated from home, via video, and consented to treatment. I discussed the limitations of evaluation and management by telemedicine and the availability of in person appointments. The patient expressed understanding and agreed to proceed.  Therapist participated from home office.  Dondra Spry reviewed the events of the past week.   Interventions: Cognitive Behavioral Therapy  Diagnosis:  MDD  Psychiatric Treatment: No   Treatment Plan:  Client Abilities/Strengths Adilen is intelligent, self-aware, motivated for change, and a good self advocate.  Support System: Friends in [place]  Client Treatment Preferences OPT  Client Statement of Needs Neelah would like to learn how she can react differently in social interactions, become more independent, identify and engage in new activities, and symptom management.  Treatment  Level Weekly/Biweekly  Symptoms  Depression: Feeling down, loss of interest, sadness, and decreased energy. (Status: maintained)  Goals:   Dondra Spry experiences symptoms of depression.  Treatment plan signed and available on s-drive: Yes   Target Date: 01/12/2025 Frequency: Weekly/Biweekly  Progress: 0 Modality: individual    Therapist will provide referrals for additional resources as appropriate.  Therapist will provide psycho-education regarding Courtnee's diagnosis and corresponding treatment approaches and interventions. Helyn Numbers will support the patient's ability to achieve the goals identified. CBT, BA, Problem-solving, Solution Focused, Mindfulness,  coping skills, & other evidenced-based practices will be used to promote progress towards healthy functioning to help manage decrease symptoms associated with their diagnosis.   Reduce overall level, frequency, and intensity of the feelings of depression, anxiety and panic evidenced by decreased overall symptoms from 6 to 7 days/week to 0 to 1 days/week per client report for at least 3 consecutive months. Verbally express understanding of the relationship between feelings of depression, and its impact on thinking patterns and behaviors. Verbalize an understanding of the role that distorted thinking plays in creating fears, excessive worry, and ruminations.  Dondra Spry participated in the creation of the treatment plan)   Helyn Numbers

## 2024-01-24 DIAGNOSIS — F331 Major depressive disorder, recurrent, moderate: Secondary | ICD-10-CM | POA: Insufficient documentation

## 2024-01-27 ENCOUNTER — Other Ambulatory Visit: Payer: Self-pay | Admitting: Family

## 2024-01-28 ENCOUNTER — Ambulatory Visit (INDEPENDENT_AMBULATORY_CARE_PROVIDER_SITE_OTHER): Payer: Medicare Other | Admitting: Psychology

## 2024-01-28 DIAGNOSIS — F331 Major depressive disorder, recurrent, moderate: Secondary | ICD-10-CM

## 2024-01-28 DIAGNOSIS — F329 Major depressive disorder, single episode, unspecified: Secondary | ICD-10-CM

## 2024-01-28 NOTE — Progress Notes (Signed)
 [facility] Counselor/Therapist Progress Note  Patient ID: Misty Walker, MRN: [id number]    Date: 01/28/24  Time Spent: 11:00 am - 11:57 am : 57 minutes   Treatment Type: Individual Therapy.  Reported Symptoms: Pt reported that she feels like a failure as a grandmother, has trouble concentrating on things, being fidgety, feeling nervous, and worrying about things.   Mental Status Exam: Appearance:  Well Groomed     Behavior: Appropriate  Motor: Normal  Speech/Language:  Normal Rate  Affect: Appropriate  Mood: normal  Thought process: normal  Thought content:   WNL  Sensory/Perceptual disturbances:   WNL  Orientation: oriented to person, place, and time/date  Attention: Good  Concentration: Good  Memory: WNL  Fund of knowledge:  Good  Insight:   Good  Judgment:  Good  Impulse Control: Good   Risk Assessment: Danger to Self:  No Self-injurious Behavior: No Danger to Others: No Duty to Warn:no Physical Aggression / Violence:No  Access to Firearms a concern: No  Gang Involvement:No   Subjective:   Misty Walker participated from home, via video, and consented to treatment. I discussed the limitations of evaluation and management by telemedicine and the availability of in person appointments. The patient expressed understanding and agreed to proceed. Therapist participated from home office.  Misty reviewed the events of the past week. Patient reported her concerns regarding relationship issues Todd  is so dependent on my feelings. Todd went to the pulmonary doctor, he has no physical reason for not doing something. Patient still feels the need to Rescue Don from his thinking of I just don't care, I can't do anything about this.  Interventions: Cognitive Behavioral Therapy  Diagnosis:  MDD  Psychiatric Treatment: Yes , via PCP  Treatment Plan:  Client Abilities/Strengths Misty Walker is intelligent, self-aware, motivated for change, and a good self advocate.    Support System: Friends in [place] of Needs Misty Walker would like to Misty Walker would like to learn how she can react differently in social interactions, become more independent, identify and engage in new activities, and symptom management.    Treatment Level Weekly/Biweekly  Symptoms  Depression: Feeling down, loss of interest, sadness, and decreased energy. (Status: maintained)   Goals:   Misty experiences symptoms of depression.   Target Date: 01/12/25 Frequency: Weekly/Biweekly  Progress: 0 Modality: individual    Therapist will provide referrals for additional resources as appropriate.  Therapist will provide psycho-education regarding Misty Walker's diagnosis and corresponding treatment approaches and interventions. Jenkins CHRISTELLA Nicolas will support the patient's ability to achieve the goals identified. CBT, BA, Problem-solving, Solution Focused, Mindfulness,  coping skills, & other evidenced-based practices will be used to promote progress towards healthy functioning to help manage decrease symptoms associated with their diagnosis.   Reduce overall level, frequency, and intensity of the feelings of depression, anxiety and panic evidenced by decreased overall symptoms from 6 to 7 days/week to 0 to 1 days/week per client report for at least 3 consecutive months. Verbally express understanding of the relationship between feelings of depression and its impact on thinking patterns and behaviors. Verbalize an understanding of the role that distorted thinking plays in creating fears, excessive worry, and ruminations.  Misty Walker participated in the creation of the treatment plan)   Jenkins CHRISTELLA Nicolas

## 2024-02-04 ENCOUNTER — Ambulatory Visit: Payer: Medicare Other | Admitting: Psychology

## 2024-02-04 DIAGNOSIS — F331 Major depressive disorder, recurrent, moderate: Secondary | ICD-10-CM

## 2024-02-04 DIAGNOSIS — F329 Major depressive disorder, single episode, unspecified: Secondary | ICD-10-CM

## 2024-02-04 NOTE — Progress Notes (Signed)
 [facility] Counselor/Therapist Progress Note  Patient ID: Misty Walker, MRN: [id number]    Date: 02/04/24  Time Spent: 11:00 am  - 11:52 am : 52 minutes  Treatment Type: Individual Therapy.  Reported Symptoms:  feels like a failure, has trouble concentrating on things, being fidgety, feeling nervous, and worrying about things.   Mental Status Exam: Appearance:  Well Groomed     Behavior: Appropriate  Motor: Normal  Speech/Language:  Normal Rate  Affect: Appropriate  Mood: normal  Thought process: normal  Thought content:   WNL  Sensory/Perceptual disturbances:   WNL  Orientation: oriented to person, place, and time/date  Attention: Good  Concentration: Good  Memory: WNL  Fund of knowledge:  Good  Insight:   Good  Judgment:  Good  Impulse Control: Good   Risk Assessment: Danger to Self:  No Self-injurious Behavior: No Danger to Others: No Duty to Warn:no Physical Aggression / Violence:No  Access to Firearms a concern: No  Gang Involvement:No   Subjective:   Ivor Messier participated from home, via video, and consented to treatment. I discussed the limitations of evaluation and management by telemedicine and the availability of in person appointments. The patient expressed understanding and agreed to proceed.  Therapist participated from home office.  Dondra Spry reviewed the events of the past week. Patient is learning that "accepting" is not as bad a word to her as it had been in the past. At our next session patient will have had a massage, and Roe Coombs will have had another doctor's appointment with hopefully more answers for patient and her husband.    Interventions: Cognitive Behavioral Therapy  Diagnosis:  MDD  Psychiatric Treatment: Yes , via PCP  Treatment Plan:  Client Abilities/Strengths Delia is intelligent, self-aware, motivated for change, and a good self advocate   Support System: Friends in [place]  Client Treatment Preferences OPT  Client  Statement of Needs Sharetta would like to  learn how she can react differently in social interactions, become more independent, identify and engage in new activities, and symptom management.     Treatment Level Weekly/Biweekly  Symptoms  Depression: Feeling down, loss of interest, sadness, and decreased energy. (Status: maintained)   Goals:   Dondra Spry experiences symptoms of depression.   Target Date: 01/12/25 Frequency: Weekly/Biweekly  Progress: 0 Modality: individual    Therapist will provide referrals for additional resources as appropriate.  Therapist will provide psycho-education regarding Lavella's diagnosis and corresponding treatment approaches and interventions. Helyn Numbers will support the patient's ability to achieve the goals identified. CBT, BA, Problem-solving, Solution Focused, Mindfulness, coping skills, & other evidenced-based practices will be used to promote progress towards healthy functioning to help manage decrease symptoms associated with their diagnosis.   Reduce overall level, frequency, and intensity of the feelings of depression, anxiety and panic evidenced by decreased overall symptoms from 6 to 7 days/week to 0 to 1 days/week per client report for at least 3 consecutive months. Verbally express understanding of the relationship between feelings of depression and its impact on thinking patterns and behaviors. Verbalize an understanding of the role that distorted thinking plays in creating fears, excessive worry, and ruminations.  Dondra Spry participated in the creation of the treatment plan)   Helyn Numbers

## 2024-02-09 ENCOUNTER — Other Ambulatory Visit: Payer: Self-pay | Admitting: Family

## 2024-02-09 DIAGNOSIS — F339 Major depressive disorder, recurrent, unspecified: Secondary | ICD-10-CM

## 2024-02-17 ENCOUNTER — Ambulatory Visit: Payer: Medicare Other | Admitting: Psychology

## 2024-02-18 ENCOUNTER — Ambulatory Visit: Payer: Medicare Other | Admitting: Psychology

## 2024-02-18 ENCOUNTER — Telehealth: Payer: Self-pay | Admitting: Family

## 2024-02-18 DIAGNOSIS — F331 Major depressive disorder, recurrent, moderate: Secondary | ICD-10-CM | POA: Diagnosis not present

## 2024-02-18 NOTE — Progress Notes (Signed)
 [facility] Counselor/Therapist Progress Note  Patient ID: Misty Walker, MRN: [id number]    Date: 02/18/24  Time Spent: 4:00 om - 4:51 pm : 51 minutes  Treatment Type: Individual Therapy.  Reported Symptoms: feels like a failure, has trouble concentrating on things, being fidgety, feeling nervous, and worrying about things.    Mental Status Exam: Appearance:  Well Groomed     Behavior: Appropriate  Motor: Normal  Speech/Language:  Normal Rate  Affect: Appropriate  Mood: normal  Thought process: normal  Thought content:   WNL  Sensory/Perceptual disturbances:   WNL  Orientation: oriented to person, place, and time/date  Attention: Good  Concentration: Good  Memory: WNL  Fund of knowledge:  Good  Insight:   Good  Judgment:  Good  Impulse Control: Good   Risk Assessment: Danger to Self:  No Self-injurious Behavior: No Danger to Others: No Duty to Warn:no Physical Aggression / Violence:No  Access to Firearms a concern: No  Gang Involvement:No   Subjective:   Misty Walker participated from home, via video, and consented to treatment. I discussed the limitations of evaluation and management by telemedicine and the availability of in person appointments. The patient expressed understanding and agreed to proceed. Therapist participated from home office.  Misty Walker reviewed the events of the past week.   Spent time with Misty Walker, walked three times and sometimes went to lunch, may have been gone for up to 3 hours, and it felt good to be out of her house. Also had a massage, and really enjoyed it. Enjoyed doing something for herself, although it was very different. Patient was glad that she and her husband have been invited to [employer] bday party first weekend in March with her little friends. Pt felt as though she was making progress with regards to making a life for herself. Some concern regarding church people's reaction when they learned of her upcoming cruise with  a friend.   Interventions: Cognitive Behavioral Therapy  Diagnosis:  Major depressive disorder, recurrent episode, moderate (HCC) 33.1  Psychiatric Treatment: Yes , via PCP  Treatment Plan:  Client Abilities/Strengths Misty Walker is intelligent, self-aware, motivated for change, and a good self advocate.  Support System: Friends in [place]  Client Treatment Preferences OPT  Client Statement of Needs Misty Walker would like to learn how she can react differently in social interactions, become more independent, identify and engage in new activities, and symptom management.      Treatment Level Weekly/Biweekly  Symptoms  Feeling down, loss of interest, sadness, and decreased energy. (Status: maintained)   Goals:   Misty Walker experiences symptoms of depression.   Target Date: 01/12/25 Frequency: Weekly/Biweekly  Progress: 0 Modality: individual    Therapist will provide referrals for additional resources as appropriate.  Therapist will provide psycho-education regarding Jaidy's diagnosis and corresponding treatment approaches and interventions. Helyn Numbers will support the patient's ability to achieve the goals identified. will employ CBT, BA, Problem-solving, Solution Focused, Mindfulness,  coping skills, & other evidenced-based practices will be used to promote progress towards healthy functioning to help manage decrease symptoms associated with their diagnosis.   Reduce overall level, frequency, and intensity of the feelings of depression, anxiety and panic evidenced by decreased overall symptoms from 6 to 7 days/week to 0 to 1 days/week per client report for at least 3 consecutive months. Verbally express understanding of the relationship between feelings of depression, and its impact on thinking patterns and behaviors. Verbalize an understanding of the role that distorted thinking plays in  creating fears, excessive worry, and ruminations.  Misty Walker participated in the creation of the  treatment plan)   Helyn Numbers

## 2024-02-18 NOTE — Telephone Encounter (Signed)
 Patient dropped off ppwk they would like to reviewd by provider. Placed in providers box at front desk .

## 2024-02-19 NOTE — Telephone Encounter (Signed)
 Have you seen this paperwork?

## 2024-02-19 NOTE — Telephone Encounter (Signed)
 Paperwork was documentation of her shingles vaccine. This has been documented in pt's chart.

## 2024-02-23 ENCOUNTER — Ambulatory Visit
Admission: RE | Admit: 2024-02-23 | Discharge: 2024-02-23 | Disposition: A | Discharge status: Home / Self Care | Payer: Medicare Other | Source: Ambulatory Visit | Attending: Family | Admitting: Family

## 2024-02-23 ENCOUNTER — Encounter: Payer: Self-pay | Admitting: Family

## 2024-02-23 DIAGNOSIS — M8580 Other specified disorders of bone density and structure, unspecified site: Secondary | ICD-10-CM | POA: Insufficient documentation

## 2024-02-23 DIAGNOSIS — Z780 Asymptomatic menopausal state: Secondary | ICD-10-CM | POA: Diagnosis not present

## 2024-02-23 DIAGNOSIS — M8588 Other specified disorders of bone density and structure, other site: Secondary | ICD-10-CM | POA: Diagnosis not present

## 2024-03-03 ENCOUNTER — Ambulatory Visit: Payer: Medicare Other | Admitting: Psychology

## 2024-03-17 ENCOUNTER — Ambulatory Visit: Payer: Medicare Other | Admitting: Psychology

## 2024-03-17 DIAGNOSIS — F331 Major depressive disorder, recurrent, moderate: Secondary | ICD-10-CM | POA: Diagnosis not present

## 2024-03-17 NOTE — Progress Notes (Signed)
 [facility] Counselor/Therapist Progress Note  Patient ID: Misty Walker, MRN: [id number]    Date: 03/17/24  Time Spent: 12:00 pm - 12:45 pm : 45 minutes   Treatment Type: Individual Therapy.  Reported Symptoms: feels like a failure, has trouble concentrating on things, being fidgety, feeling nervous, and worrying about things.     Mental Status Exam: Appearance:  Well Groomed     Behavior: Appropriate  Motor: Normal  Speech/Language:  Normal Rate  Affect: Appropriate  Mood: normal  Thought process: normal  Thought content:   WNL  Sensory/Perceptual disturbances:   WNL  Orientation: oriented to person, place, and time/date  Attention: Good  Concentration: Good  Memory: WNL  Fund of knowledge:  Good  Insight:   Good  Judgment:  Good  Impulse Control: Good   Risk Assessment: Danger to Self:  No Self-injurious Behavior: No Danger to Others: No Duty to Warn:no Physical Aggression / Violence:No  Access to Firearms a concern: No  Gang Involvement:No   Subjective:   Misty Walker participated from home, via video, and consented to treatment. I discussed the limitations of evaluation and management by telemedicine and the availability of in person appointments. The patient expressed understanding and agreed to proceed. Therapist participated from home office.  Dondra Spry reviewed the events of the past week.   Patient had canceled our last session because she was doing well and wanted to make more progress before another session. Patient was spending time with her new friend [name] and patient was very glad that she had initiated that friendship, as opposed to talking herself out of it. Patient's relationship with her daughter has also improved and patient's daughter seems happy that patient is creating more of a life for herself. Patient noted that her husband "finally" has an appointment with a neurologist, but that appointment isn't scheduled until June. Patient is planning to  go on a cruise with a friend in May, and patient feels "judged" by some people who are involved in one of her social groups.   Interventions: Cognitive Behavioral Therapy  Diagnosis:  Major depressive disorder, recurrent episode, moderate (HCC) 33.1   Psychiatric Treatment: Yes , via PCP  Treatment Plan:  Client Abilities/Strengths Misty Walker is intelligent, self-aware, motivated for change, and a good self advocate.   Support System: Friends in [place]  Client Treatment Preferences OPT  Client Statement of Needs Misty Walker would like to learn how she can react differently in social interactions, become more independent, identify and engage in new activities, and symptom    Treatment Level Weekly/Biweekly  Symptoms  Feeling down, loss of interest, sadness, and decreased energy. (Status: maintained)   Goals:   Dondra Spry experiences symptoms of depression.    Target Date: 01/12/25 Frequency: Weekly  Progress: 0 Modality: individual    Therapist will provide referrals for additional resources as appropriate.  Therapist will provide psycho-education regarding Misty Walker's diagnosis and corresponding treatment approaches and interventions. Helyn Numbers will support the patient's ability to achieve the goals identified. will employ CBT, BA, Problem-solving, Solution Focused, Mindfulness,  coping skills, & other evidenced-based practices will be used to promote progress towards healthy functioning to help manage decrease symptoms associated with their diagnosis.   Reduce overall level, frequency, and intensity of the feelings of depression, anxiety and panic evidenced by decreased overall symptoms from 6 to 7 days/week to 0 to 1 days/week per client report for at least 3 consecutive months. Verbally express understanding of the relationship between feelings of depression and its impact  on thinking patterns and behaviors. Verbalize an understanding of the role that distorted thinking plays in  creating fears, excessive worry, and ruminations.  Dondra Spry participated in the creation of the treatment plan)    Helyn Numbers

## 2024-03-22 DIAGNOSIS — L309 Dermatitis, unspecified: Secondary | ICD-10-CM | POA: Diagnosis not present

## 2024-03-22 DIAGNOSIS — D2261 Melanocytic nevi of right upper limb, including shoulder: Secondary | ICD-10-CM | POA: Diagnosis not present

## 2024-03-22 DIAGNOSIS — D225 Melanocytic nevi of trunk: Secondary | ICD-10-CM | POA: Diagnosis not present

## 2024-03-22 DIAGNOSIS — Z85828 Personal history of other malignant neoplasm of skin: Secondary | ICD-10-CM | POA: Diagnosis not present

## 2024-03-22 DIAGNOSIS — D2262 Melanocytic nevi of left upper limb, including shoulder: Secondary | ICD-10-CM | POA: Diagnosis not present

## 2024-03-22 DIAGNOSIS — L718 Other rosacea: Secondary | ICD-10-CM | POA: Diagnosis not present

## 2024-03-31 ENCOUNTER — Ambulatory Visit: Payer: Medicare Other | Admitting: Psychology

## 2024-04-14 ENCOUNTER — Ambulatory Visit: Payer: Medicare Other | Admitting: Psychology

## 2024-04-28 ENCOUNTER — Ambulatory Visit: Admitting: Psychology

## 2024-05-12 ENCOUNTER — Ambulatory Visit: Admitting: Psychology

## 2024-06-07 ENCOUNTER — Ambulatory Visit: Admitting: Family

## 2024-06-12 ENCOUNTER — Other Ambulatory Visit: Payer: Self-pay | Admitting: Family

## 2024-06-12 DIAGNOSIS — E785 Hyperlipidemia, unspecified: Secondary | ICD-10-CM

## 2024-07-27 ENCOUNTER — Ambulatory Visit (INDEPENDENT_AMBULATORY_CARE_PROVIDER_SITE_OTHER)
Admission: RE | Admit: 2024-07-27 | Discharge: 2024-07-27 | Disposition: A | Discharge status: Home / Self Care | Source: Ambulatory Visit | Attending: Family | Admitting: Family

## 2024-07-27 ENCOUNTER — Ambulatory Visit: Payer: Self-pay | Admitting: Family

## 2024-07-27 ENCOUNTER — Encounter: Payer: Self-pay | Admitting: Family

## 2024-07-27 ENCOUNTER — Ambulatory Visit (INDEPENDENT_AMBULATORY_CARE_PROVIDER_SITE_OTHER): Admitting: Family

## 2024-07-27 VITALS — BP 126/76 | HR 59 | Temp 98.4°F | Ht 67.0 in | Wt 188.0 lb

## 2024-07-27 DIAGNOSIS — M19071 Primary osteoarthritis, right ankle and foot: Secondary | ICD-10-CM | POA: Diagnosis not present

## 2024-07-27 DIAGNOSIS — E785 Hyperlipidemia, unspecified: Secondary | ICD-10-CM

## 2024-07-27 DIAGNOSIS — M79671 Pain in right foot: Secondary | ICD-10-CM

## 2024-07-27 DIAGNOSIS — M7750 Other enthesopathy of unspecified foot: Secondary | ICD-10-CM

## 2024-07-27 DIAGNOSIS — M85871 Other specified disorders of bone density and structure, right ankle and foot: Secondary | ICD-10-CM | POA: Diagnosis not present

## 2024-07-27 DIAGNOSIS — M7731 Calcaneal spur, right foot: Secondary | ICD-10-CM | POA: Diagnosis not present

## 2024-07-27 MED ORDER — ROSUVASTATIN CALCIUM 5 MG PO TABS: 5.0000 mg | ORAL_TABLET | Freq: Every day | ORAL | 3 refills | Status: AC

## 2024-07-27 MED ORDER — MELOXICAM 7.5 MG PO TABS: 7.5000 mg | ORAL_TABLET | Freq: Every day | ORAL | 0 refills | Status: DC

## 2024-07-27 NOTE — Assessment & Plan Note (Signed)
 Right foot xray pending results.  Suspect heel spur  Rx meloxicam  trial  Tylenol prn for breakthrough pain Heel pads and supportive shoes  Heat ice prn.  If no improvement will refer to podiatry

## 2024-07-27 NOTE — Progress Notes (Signed)
 Established Patient Office Visit  Subjective:   Patient ID: Misty Walker, female    DOB: 12-20-1956  Age: 68 y.o. MRN: [id number]  CC:  Chief Complaint  Patient presents with   Foot Pain    R - ongoing for 2 months.    HPI: Misty Walker is a 68 y.o. female presenting on 07/27/2024 for Foot Pain (R - ongoing for 2 months.)  Has had right foot pain that is ongoing over the last two months.  At times intermittently will be 'horrible' walking aggravates it.  Feels it more so in the heel. Will use a cane at home just so that it doesn't hurt her when sh walks, as weight bearing also aggravates it.  No known trauma or injury.  She has been using heel pads which also help her   Typically walks two miles a day, went for a regular walk two months ago, and while walking towards the grocery store the pain started and has not really let up. Even at rest she will have a dull aching pain, and stabbing intermittent pain in the back of heel will feel it as well when she is falling to sleep.   She has used tylenol and voltaren gel and there is some relief with the pain with this as well.        ROS: Negative unless specifically indicated above in HPI.   Relevant past medical history reviewed and updated as indicated.   Allergies and medications reviewed and updated.   Current Outpatient Medications:    atenolol  (TENORMIN ) 25 MG tablet, TAKE 1 TABLET BY MOUTH DAILY, Disp: 100 tablet, Rfl: 2   FLUoxetine  (PROZAC ) 20 MG capsule, TAKE 1 CAPSULE BY MOUTH DAILY, Disp: 90 capsule, Rfl: 3   Krill Oil 350 MG CAPS, Take by mouth daily., Disp: , Rfl:    meloxicam  (MOBIC ) 7.5 MG tablet, Take 1 tablet (7.5 mg total) by mouth daily., Disp: 30 tablet, Rfl: 0   Multiple Vitamin (MULTIVITAMIN ADULT PO), Take by mouth., Disp: , Rfl:    Vitamin D -Vitamin K (VITAMIN K2-VITAMIN D3 PO), Take by mouth., Disp: , Rfl:    rosuvastatin  (CRESTOR ) 5 MG tablet, Take 1 tablet (5 mg total) by mouth daily., Disp: 90 tablet,  Rfl: 3  No Known Allergies  Objective:   BP 126/76 (BP Location: Right Arm, Patient Position: Sitting, Cuff Size: Normal)   Pulse (!) 59   Temp 98.4 F (36.9 C) (Temporal)   Ht 5' 7 (1.702 m)   Wt 188 lb (85.3 kg)   SpO2 98%   BMI 29.44 kg/m    Physical Exam Vitals reviewed.  Musculoskeletal:     Comments: Palpation at base of heel laterally with point tenderness  Pain localized to that spot as well with flexion not extension  Some swelling in this same area. No erythema or warmth.   No tenderness in achilles     ;  Assessment & Plan:  Hyperlipidemia, unspecified hyperlipidemia type -     Rosuvastatin  Calcium ; Take 1 tablet (5 mg total) by mouth daily.  Dispense: 90 tablet; Refill: 3  Intractable right heel pain Assessment & Plan: Right foot xray pending results.  Suspect heel spur  Rx meloxicam  trial  Tylenol prn for breakthrough pain Heel pads and supportive shoes  Heat ice prn.  If no improvement will refer to podiatry   Orders: -     DG Foot Complete Right; Future -     Meloxicam ; Take 1 tablet (7.5 mg  total) by mouth daily.  Dispense: 30 tablet; Refill: 0     Follow up plan: Return if symptoms worsen or fail to improve.  Ginger Patrick, FNP

## 2024-08-23 ENCOUNTER — Encounter: Payer: Self-pay | Admitting: Family

## 2024-08-23 ENCOUNTER — Other Ambulatory Visit: Payer: Self-pay | Admitting: Family

## 2024-08-23 DIAGNOSIS — M79671 Pain in right foot: Secondary | ICD-10-CM

## 2024-08-24 MED ORDER — MELOXICAM 15 MG PO TABS: 15.0000 mg | ORAL_TABLET | Freq: Every day | ORAL | Status: DC

## 2024-08-24 NOTE — Addendum Note (Signed)
 Addended by: CORWIN ANTU on: 08/24/2024 03:37 PM   Modules accepted: Orders

## 2024-08-29 DIAGNOSIS — H35372 Puckering of macula, left eye: Secondary | ICD-10-CM | POA: Diagnosis not present

## 2024-08-29 DIAGNOSIS — H524 Presbyopia: Secondary | ICD-10-CM | POA: Diagnosis not present

## 2024-08-29 DIAGNOSIS — H0288B Meibomian gland dysfunction left eye, upper and lower eyelids: Secondary | ICD-10-CM | POA: Diagnosis not present

## 2024-08-29 DIAGNOSIS — H52213 Irregular astigmatism, bilateral: Secondary | ICD-10-CM | POA: Diagnosis not present

## 2024-08-29 DIAGNOSIS — H0288A Meibomian gland dysfunction right eye, upper and lower eyelids: Secondary | ICD-10-CM | POA: Diagnosis not present

## 2024-08-29 DIAGNOSIS — H5203 Hypermetropia, bilateral: Secondary | ICD-10-CM | POA: Diagnosis not present

## 2024-08-29 MED ORDER — MELOXICAM 7.5 MG PO TABS: 7.5000 mg | ORAL_TABLET | Freq: Every day | ORAL | Status: DC

## 2024-08-30 ENCOUNTER — Ambulatory Visit: Admitting: Podiatry

## 2024-08-30 ENCOUNTER — Encounter: Payer: Self-pay | Admitting: Podiatry

## 2024-08-30 VITALS — Ht 67.0 in | Wt 188.0 lb

## 2024-08-30 DIAGNOSIS — M722 Plantar fascial fibromatosis: Secondary | ICD-10-CM

## 2024-08-30 MED ORDER — MELOXICAM 15 MG PO TABS
15.0000 mg | ORAL_TABLET | Freq: Every day | ORAL | 1 refills | Status: AC
Start: 2024-08-30 — End: 2024-12-28

## 2024-08-30 MED ORDER — BETAMETHASONE SOD PHOS & ACET 6 (3-3) MG/ML IJ SUSP
3.0000 mg | Freq: Once | INTRAMUSCULAR | Status: AC
  Administered 2024-08-30: 3 mg via INTRA_ARTICULAR

## 2024-08-30 NOTE — Progress Notes (Signed)
   Chief Complaint  Patient presents with   Heel Spurs    Pt is here due to right foot pain, started in May seen PCP a month ago x-ray was done was told that she has a heel spur, was prescribe meloxicam  which is helpful, states the pain is mostly in her heel.     Subjective: 68 y.o. female presenting today for onset of right heel pain ongoing since May 2025.  She was prescribed meloxicam  7.5 mg from her PCP which only helped minimally.   Past Medical History:  Diagnosis Date   Hyperlipidemia     Past Surgical History:  Procedure Laterality Date   NO PAST SURGERIES     No Known Allergies    Objective: Physical Exam General: The patient is alert and oriented x3 in no acute distress.  Dermatology: Skin is warm, dry and supple bilateral lower extremities. Negative for open lesions or macerations bilateral.   Vascular: Dorsalis Pedis and Posterior Tibial pulses palpable bilateral.  Capillary fill time is immediate to all digits.  Neurological: Grossly intact via light touch  Musculoskeletal: Tenderness to palpation to the plantar aspect of the right heel along the plantar fascia. All other joints range of motion within normal limits bilateral. Strength 5/5 in all groups bilateral.   Radiographic exam RT foot 08/30/2024: Normal osseous mineralization. Joint spaces preserved. No fracture/dislocation/boney destruction. No other soft tissue abnormalities or radiopaque foreign bodies.   Assessment: 1. Plantar fasciitis right  Plan of Care:  -Patient evaluated. Xrays reviewed.   -Injection of 0.5cc Celestone  soluspan injected into the right plantar fascia  -Prescription for meloxicam  15 mg daily.  Initially she was prescribed 7.5 mg from her PCP.  Discontinue -Advised against going barefoot.  Recommended supportive tennis shoes and sneakers -Recommend daily calf stretching, specifically calf stretches to alleviate posterior calf tightness -OTC power step insoles dispensed.  Wear  daily -Return to clinic 4 weeks   Thresa EMERSON Sar, DPM [facility]  Dr. Thresa EMERSON Sar, DPM    [address] [place], [place] [address]                Office [phone]  Fax [phone]

## 2024-10-04 ENCOUNTER — Ambulatory Visit: Admitting: Podiatry

## 2024-10-05 ENCOUNTER — Other Ambulatory Visit: Payer: Self-pay | Admitting: Family

## 2024-11-18 ENCOUNTER — Encounter: Payer: Self-pay | Admitting: Pharmacist

## 2024-11-18 NOTE — Progress Notes (Signed)
 Pharmacy Quality Measure Review  This patient is appearing on a report for being at risk of failing the adherence measure for cholesterol (statin) medications this calendar year.   Medication: rosuvastatin  5 mg Last fill date: 07/27/24 for 90 day supply  Insurance report was not up to date. No action needed at this time.  Medication has been refilled as of 10/22/24 x90ds. Next refill due 2026

## 2024-12-06 ENCOUNTER — Ambulatory Visit: Payer: Self-pay | Admitting: Family

## 2024-12-06 ENCOUNTER — Encounter: Payer: Self-pay | Admitting: Family

## 2024-12-06 ENCOUNTER — Ambulatory Visit: Admitting: Family

## 2024-12-06 VITALS — BP 134/78 | HR 62 | Temp 98.2°F | Ht 67.0 in | Wt 186.6 lb

## 2024-12-06 DIAGNOSIS — E538 Deficiency of other specified B group vitamins: Secondary | ICD-10-CM

## 2024-12-06 DIAGNOSIS — Z1231 Encounter for screening mammogram for malignant neoplasm of breast: Secondary | ICD-10-CM

## 2024-12-06 DIAGNOSIS — K429 Umbilical hernia without obstruction or gangrene: Secondary | ICD-10-CM

## 2024-12-06 DIAGNOSIS — E782 Mixed hyperlipidemia: Secondary | ICD-10-CM

## 2024-12-06 DIAGNOSIS — M8580 Other specified disorders of bone density and structure, unspecified site: Secondary | ICD-10-CM | POA: Diagnosis not present

## 2024-12-06 DIAGNOSIS — Z0000 Encounter for general adult medical examination without abnormal findings: Secondary | ICD-10-CM

## 2024-12-06 DIAGNOSIS — F331 Major depressive disorder, recurrent, moderate: Secondary | ICD-10-CM | POA: Diagnosis not present

## 2024-12-06 LAB — CBC
HCT: 42.5 % (ref 36.0–46.0)
Hemoglobin: 14.4 g/dL (ref 12.0–15.0)
MCHC: 33.8 g/dL (ref 30.0–36.0)
MCV: 94.1 fl (ref 78.0–100.0)
Platelets: 311 K/uL (ref 150.0–400.0)
RBC: 4.51 Mil/uL (ref 3.87–5.11)
RDW: 12.3 % (ref 11.5–15.5)
WBC: 9 K/uL (ref 4.0–10.5)

## 2024-12-06 LAB — COMPREHENSIVE METABOLIC PANEL WITH GFR
ALT: 20 U/L (ref 0–35)
AST: 19 U/L (ref 5–37)
Albumin: 4.5 g/dL (ref 3.5–5.2)
Alkaline Phosphatase: 79 U/L (ref 39–117)
BUN: 13 mg/dL (ref 6–23)
CO2: 29 meq/L (ref 19–32)
Calcium: 9.5 mg/dL (ref 8.4–10.5)
Chloride: 99 meq/L (ref 96–112)
Creatinine, Ser: 0.73 mg/dL (ref 0.40–1.20)
GFR: 83.17 mL/min (ref >60.00)
Glucose, Bld: 90 mg/dL (ref 70–99)
Potassium: 4.1 meq/L (ref 3.5–5.1)
Sodium: 137 meq/L (ref 135–145)
Total Bilirubin: 0.5 mg/dL (ref 0.2–1.2)
Total Protein: 6.6 g/dL (ref 6.0–8.3)

## 2024-12-06 LAB — LIPID PANEL
Cholesterol: 150 mg/dL (ref 28–200)
HDL: 60.5 mg/dL (ref >39.00)
LDL Cholesterol: 67 mg/dL (ref 0–99)
NonHDL: 89.61
Total CHOL/HDL Ratio: 2
Triglycerides: 115 mg/dL (ref 0.0–149.0)
VLDL: 23 mg/dL (ref 0.0–40.0)

## 2024-12-06 LAB — VITAMIN B12: Vitamin B-12: 467 pg/mL (ref 211–911)

## 2024-12-06 NOTE — Patient Instructions (Signed)
°  I have sent in your order electronically for the following: ultrasound abdomen  at this location below. Please call to schedule the appointment at your convenience  [facility] outpatient imaging center off [address], [place] [address] Phone [phone]-  8-5 pm    ------------------------------------ I have sent an electronic order over to your preferred location for the following:   []   2D Mammogram  [x]   3D Mammogram  []   Bone Density   Please give this center a call to get scheduled at your convenience.  [x]   [facility]  [address] [place] [address]  [phone]  Make sure to wear two piece  clothing  No lotions powders or deodorants the day of the appointment Make sure to bring picture ID and insurance card.  Bring list of medications you are currently taking including any supplements.

## 2024-12-06 NOTE — Progress Notes (Signed)
 Subjective:  Patient ID: Misty Walker, female    DOB: 08/10/1956  Age: 68 y.o. MRN: [id number]  Patient Care Team: Corwin Antu, FNP as PCP - General (Family Medicine) Darliss Rogue, MD as Consulting Physician (Cardiology)   CC:  Chief Complaint  Patient presents with   Annual Exam    HPI Misty Walker is a 68 y.o. female who presents today for an annual physical exam. She reports consuming a general diet. Walks 45 minutes a day She generally feels well. She reports sleeping well. She does have additional problems to discuss today.   Vision:Within last year Dental:Receives regular dental care  Mammogram: 12/24/23 Cologuard 12/24 Bone density scan:  Pt is with acute concerns.   Discussed the use of AI scribe software for clinical note transcription with the patient, who gave verbal consent to proceed.  History of Present Illness Misty Walker is a 68 year old female who presents with concerns about weight gain and abdominal bloating.  She is experiencing weight gain, particularly in her upper abdomen and hips, along with a sensation of bloating without discomfort. Despite these concerns, she has lost two pounds since August, going from 188 pounds to 186 pounds. She maintains regular bowel movements with the help of a probiotic and denies recent constipation.  She engages in daily physical activity, walking for about 45 minutes, and is attempting to incorporate light weight training, although joint pain presents a challenge. Her diet is high in starches and carbohydrates, and she is mindful of portion sizes. She does not consume sodas and reports sleeping well at night.  She has noticed her belly button protruding recently, although she has not experienced significant pain. 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 No recent diarrhea or significant changes in bowel habits are reported.  She reports feeling well overall, with an improved mood despite  recent challenges, including her husband's Alzheimer's diagnosis. She has adjusted to her new living environment after four years and feels better about it.  She has never been a smoker and maintains regular eye and dental exams. She is up to date with her mammogram and Cologuard screening.   Wt Readings from Last 3 Encounters:  12/06/24 186 lb 9.6 oz (84.6 kg)  08/30/24 188 lb (85.3 kg)  07/27/24 188 lb (85.3 kg)    Advanced Directives Patient does have advanced directives. She does not have a copy in the electronic medical record.   DEPRESSION SCREENING    12/06/2024    7:45 AM 12/04/2023    9:46 AM 09/14/2023   10:00 AM 05/06/2023    9:00 AM 09/10/2022    9:59 AM 09/10/2022    9:58 AM 03/20/2022    9:15 AM  PHQ 2/9 Scores  PHQ - 2 Score 1 3 0  1 0   PHQ- 9 Score 1 5    3   0    Exception Documentation    Patient refusal   --     Data saved with a previous flowsheet row definition     ROS: Negative unless specifically indicated above in HPI.   Current Medications[1]    Objective:    BP 134/78 (BP Location: Right Arm, Patient Position: Sitting, Cuff Size: Large)   Pulse 62   Temp 98.2 F (36.8 C) (Temporal)   Ht 5' 7 (1.702 m)   Wt 186 lb 9.6 oz (84.6 kg)   SpO2 97%   BMI 29.23 kg/m   BP Readings from Last 3  Encounters:  12/06/24 134/78  07/27/24 126/76  12/04/23 132/80      Physical Exam Vitals reviewed.  Constitutional:      General: She is not in acute distress.    Appearance: Normal appearance. She is normal weight. She is not ill-appearing.  HENT:     Head: Normocephalic.     Right Ear: Tympanic membrane normal.     Left Ear: Tympanic membrane normal.     Nose: Nose normal.     Mouth/Throat:     Mouth: Mucous membranes are moist.  Eyes:     Extraocular Movements: Extraocular movements intact.     Pupils: Pupils are equal, round, and reactive to light.  Cardiovascular:     Rate and Rhythm: Normal rate and regular rhythm.  Pulmonary:      Effort: Pulmonary effort is normal.     Breath sounds: Normal breath sounds.  Abdominal:     General: Abdomen is flat. Bowel sounds are normal.     Palpations: Abdomen is soft.     Tenderness: There is no guarding or rebound.     Hernia: A hernia (umbilical reducible) is present.  Musculoskeletal:        General: Normal range of motion.     Cervical back: Normal range of motion.  Skin:    General: Skin is warm.     Capillary Refill: Capillary refill takes less than 2 seconds.  Neurological:     General: No focal deficit present.     Mental Status: She is alert.  Psychiatric:        Mood and Affect: Mood normal.        Behavior: Behavior normal.        Thought Content: Thought content normal.        Judgment: Judgment normal.       Results DIAGNOSTIC Cologuard: Negative (11/2023) Bone density: T-score -1.6 to -1.7, right hip -0.5 to -0.4, spine -1.6 (02/23/2024)      Assessment & Plan:   Assessment and Plan Assessment & Plan Umbilical hernia Small umbilical hernia at the umbilicus with tenderness upon palpation. No significant bulge or irreducibility. Possible contribution to bloating and discomfort. No signs of incarceration or strangulation. - Avoid straining and heavy lifting. - Consider referral to general surgery for evaluation if symptoms worsen or persist. - Ordered ultrasound to assess hernia size and characteristics.  Osteopenia Bone density shows slight improvement with T-score of -1.6 to -1.7. Slight improvement noted in spine and right hip. Risk of progression to osteoporosis. - Encouraged weight-bearing exercises to improve bone density.  Major depressive disorder, recurrent, moderate Depression symptoms have improved. Recent stressors include husband's Alzheimer's diagnosis, but overall mood is stable. - Continue current management and monitor mood.  Mixed hyperlipidemia Dietary modifications discussed to manage cholesterol levels, including reducing  starch intake and choosing whole grains. - Encouraged dietary modifications to manage cholesterol levels.  Low serum vitamin B12 Vitamin B12 levels to be checked as part of routine lab work. - Ordered lab tests to check vitamin B12 levels.  General Health Maintenance Routine health maintenance discussed, including mammogram scheduling and dietary modifications. - Scheduled mammogram after January 2nd. - Encouraged dietary modifications, including reducing starch intake and choosing whole grains. - Encouraged regular exercise, including walking and light weight training. -Patient Counseling(The following topics were reviewed):  Preventative care handout given to pt  Health maintenance and immunizations reviewed. Please refer to Health maintenance section. Pt advised on safe sex, wearing seatbelts in car, and proper  nutrition labwork ordered today for annual Dental health: Discussed importance of regular tooth brushing, flossing, and dental visits.          Follow-up: Return in about 1 year (around 12/06/2025) for f/u CPE.   Ginger Patrick, FNP      [1]  Current Outpatient Medications:    atenolol  (TENORMIN ) 25 MG tablet, TAKE 1 TABLET BY MOUTH DAILY, Disp: 100 tablet, Rfl: 1   FLUoxetine  (PROZAC ) 20 MG capsule, TAKE 1 CAPSULE BY MOUTH DAILY, Disp: 90 capsule, Rfl: 3   Krill Oil 350 MG CAPS, Take by mouth daily., Disp: , Rfl:    meloxicam  (MOBIC ) 15 MG tablet, Take 1 tablet (15 mg total) by mouth daily. (Patient taking differently: Take 15 mg by mouth daily as needed.), Disp: 60 tablet, Rfl: 1   Multiple Vitamin (MULTIVITAMIN ADULT PO), Take by mouth., Disp: , Rfl:    rosuvastatin  (CRESTOR ) 5 MG tablet, Take 1 tablet (5 mg total) by mouth daily., Disp: 90 tablet, Rfl: 3   Vitamin D -Vitamin K (VITAMIN K2-VITAMIN D3 PO), Take by mouth., Disp: , Rfl:

## 2024-12-12 ENCOUNTER — Telehealth: Payer: Self-pay | Admitting: Family

## 2024-12-12 NOTE — Telephone Encounter (Signed)
 Spoke with Mallory with [facility] ultrasound dept. They had a question about the pt's ultrasound order, they wanted to make sure that the hernia was the only thing Misty Walker was worried about. To the best of my ability I have answered her question.

## 2024-12-12 NOTE — Telephone Encounter (Signed)
 Copied from CRM #[id number]. Topic: Referral - Question >> Dec 12, 2024  9:12 AM Antwanette L wrote: Reason for CRM: Mliss from the [facility] is calling because they received an order for a us  abdomen ultrasound 45  and would like to confirm the reason for the order and ask a few questions. The referral lists the indication as 'umbilical hernia without obstruction and without gangrene. Mliss is requesting a callback at [phone]

## 2024-12-13 ENCOUNTER — Ambulatory Visit
Admission: RE | Admit: 2024-12-13 | Discharge: 2024-12-13 | Disposition: A | Discharge status: Home / Self Care | Source: Ambulatory Visit | Attending: Family | Admitting: Family

## 2024-12-13 DIAGNOSIS — K429 Umbilical hernia without obstruction or gangrene: Secondary | ICD-10-CM | POA: Insufficient documentation

## 2025-01-03 ENCOUNTER — Other Ambulatory Visit: Payer: Self-pay | Admitting: Family

## 2025-01-03 DIAGNOSIS — F339 Major depressive disorder, recurrent, unspecified: Secondary | ICD-10-CM

## 2025-01-06 ENCOUNTER — Telehealth: Payer: Self-pay | Admitting: Surgery

## 2025-01-06 ENCOUNTER — Encounter: Payer: Self-pay | Admitting: Surgery

## 2025-01-06 ENCOUNTER — Other Ambulatory Visit: Payer: Self-pay

## 2025-01-06 ENCOUNTER — Ambulatory Visit (INDEPENDENT_AMBULATORY_CARE_PROVIDER_SITE_OTHER): Admitting: Surgery

## 2025-01-06 ENCOUNTER — Encounter
Admission: RE | Admit: 2025-01-06 | Discharge: 2025-01-06 | Disposition: A | Discharge status: Home / Self Care | Source: Ambulatory Visit | Attending: Surgery | Admitting: Surgery

## 2025-01-06 VITALS — BP 145/72 | HR 75 | Temp 98.5°F | Ht 67.0 in | Wt 184.8 lb

## 2025-01-06 DIAGNOSIS — K429 Umbilical hernia without obstruction or gangrene: Secondary | ICD-10-CM

## 2025-01-06 DIAGNOSIS — Z01810 Encounter for preprocedural cardiovascular examination: Secondary | ICD-10-CM

## 2025-01-06 DIAGNOSIS — I341 Nonrheumatic mitral (valve) prolapse: Secondary | ICD-10-CM

## 2025-01-06 HISTORY — DX: Unspecified osteoarthritis, unspecified site: M19.90

## 2025-01-06 HISTORY — DX: Malignant (primary) neoplasm, unspecified: C80.1

## 2025-01-06 HISTORY — DX: Pneumonia, unspecified organism: J18.9

## 2025-01-06 HISTORY — DX: Umbilical hernia without obstruction or gangrene: K42.9

## 2025-01-06 HISTORY — DX: Depression, unspecified: F32.A

## 2025-01-06 NOTE — Addendum Note (Signed)
 Addended by: DESIDERIO ALOYSIUS CROME on: 01/06/2025 12:59 PM   Modules accepted: Level of Service

## 2025-01-06 NOTE — Telephone Encounter (Signed)
 Patient has been advised of Pre-Admission date/time, and Surgery date at [facility].  Surgery Date: 01/10/25 Preadmission Testing Date: Preadmissions to call patient.   Patient informed of the scheduling process and surgery information given at time of office visit.   Patient has been made aware to call [phone], between 1-3:00pm the day before surgery, to find out what time to arrive for surgery.

## 2025-01-06 NOTE — H&P (View-Only) (Signed)
 " 01/06/2025  Reason for Visit: Umbilical hernia  Requesting Provider: Ginger Patrick, FNP  History of Present Illness: Misty Walker is a 69 y.o. female presenting for evaluation of an umbilical hernia.  The patient reports having had a prior laparoscopic procedure about 40 years ago to evaluate for infertility issues.  She reports incision was at the umbilicus.  She reports recently noticing area of bulging at the umbilicus.  However she denies any abdominal pain in the area.  She reports sometimes some pulling or tightness sensation depending how she is moving but denies any pressure or tenderness or pain at the umbilicus.  Denies any nausea or vomiting.  She saw her PCP recently on 12/06/2024 and was diagnosed with a reducible umbilical hernia.  She had an ultrasound of the abdomen on 12/13/2024 which showed a small umbilical hernia defect containing small bowel.  The patient denies any other abdominal surgeries.  Past Medical History: Past Medical History:  Diagnosis Date   Hyperlipidemia      Past Surgical History: Past Surgical History:  Procedure Laterality Date   DIAGNOSTIC LAPAROSCOPY  1980   laparoscopy in early 1980s    Home Medications: Prior to Admission medications  Medication Sig Start Date End Date Taking? Authorizing Provider  atenolol  (TENORMIN ) 25 MG tablet TAKE 1 TABLET BY MOUTH DAILY 10/06/24  Yes Dugal, Tabitha, FNP  FLUoxetine  (PROZAC ) 20 MG capsule TAKE 1 CAPSULE BY MOUTH DAILY 01/04/25  Yes Dugal, Tabitha, FNP  Multiple Vitamin (MULTIVITAMIN ADULT PO) Take 1 tablet by mouth daily. Rainbow lite   Yes [provider]  Probiotic Product (PROBIOTIC ADVANCED PO) Take 1 tablet by mouth daily.   Yes [provider]  rosuvastatin  (CRESTOR ) 5 MG tablet Take 1 tablet (5 mg total) by mouth daily. 07/27/24  Yes Dugal, Tabitha, FNP  Vitamin D -Vitamin K (VITAMIN K2-VITAMIN D3 PO) Take 1,000 Units by mouth 3 (three) times a week.   Yes [provider]   CALCIUM  PO Take 600 mg by mouth daily.    [provider]  MAGNESIUM CITRATE PO Take 200 mg by mouth daily.    [provider]  Omega-3 Fatty Acids (OMEGA-3 PO) Take 335 mg by mouth daily. [employer] health    [provider]    Allergies: Allergies[1]  Social History:  reports that she has never smoked. She has never used smokeless tobacco. She reports that she does not currently use alcohol. She reports that she does not use drugs.   Family History: Family History  Problem Relation Age of Onset   Hypertension Mother    Breast cancer Neg Hx     Review of Systems: Review of Systems  Constitutional:  Negative for chills and fever.  HENT:  Negative for hearing loss.   Respiratory:  Negative for shortness of breath.   Cardiovascular:  Negative for chest pain.  Gastrointestinal:  Negative for abdominal pain, nausea and vomiting.  Genitourinary:  Negative for dysuria.  Musculoskeletal:  Negative for myalgias.  Skin:  Negative for rash.  Neurological:  Negative for dizziness.  Psychiatric/Behavioral:  Negative for depression.     Physical Exam BP (!) 145/72   Pulse 75   Temp 98.5 F (36.9 C) (Oral)   Ht 5' 7 (1.702 m)   Wt 184 lb 12.8 oz (83.8 kg)   SpO2 97%   BMI 28.94 kg/m  CONSTITUTIONAL: No acute distress, well-nourished HEENT:  Normocephalic, atraumatic, extraocular motion intact. NECK: Trachea is midline, and there is no jugular venous distension.  RESPIRATORY:  Lungs are clear, and breath sounds are equal bilaterally. Normal respiratory effort without pathologic use of accessory muscles. CARDIOVASCULAR: Heart is regular without murmurs, gallops, or rubs. GI: The abdomen is soft, nondistended, nontender to palpation.  The patient reports of some pressure soreness at the umbilicus with deep palpation.  The patient has a reducible umbilical hernia that is about 2 cm in size at the site of prior umbilical scar from her laparoscopy.  There are no  inguinal hernias on either side.  MUSCULOSKELETAL:  Normal muscle strength and tone in all four extremities.  No peripheral edema or cyanosis. SKIN: Skin turgor is normal. There are no pathologic skin lesions.  NEUROLOGIC:  Motor and sensation is grossly normal.  Cranial nerves are grossly intact. PSYCH:  Alert and oriented to person, place and time. Affect is normal.  Laboratory Analysis: Labs from 12/06/2024: Sodium 137, potassium 4.1, chloride 99, CO2 29, BUN 13, creatinine 0.73.  LFTs within normal limits.  WBC 9, hemoglobin 14.4, hematocrit 42.5, platelets 311.  Imaging: Ultrasound abdomen on 12/13/2024: IMPRESSION: 4.4 x 1.8 x 2.9 cm umbilical hernia, containing bowel and fat  Assessment and Plan: This is a 69 y.o. female with a reducible umbilical hernia.  - Discussed with the patient the findings on her ultrasound as well as on exam today.  I think the measurement on the ultrasound of 4.4 cm is regarding the hernia sac rather than the hernia defect itself.  The defect itself is about 2 cm in size.  Discussed with the patient the different categories of hernias ranging from reducible, 2 incarcerated, to strangulated.  In her case, her hernia is reducible, however, it does contain a knuckle of small bowel.  Discussed with her that even though she is otherwise asymptomatic from the hernia, my main concern with a hernia containing bowel is the potential progression to something more concerning which would require more urgent surgery.  As such, I would recommend to proceed with surgical repair of her hernia.  However, given that she is asymptomatic, we do have some flexibility and timing.  I discussed with the patient the potential options for surgery including open umbilical hernia repair versus robotic/laparoscopic umbilical hernia repair.  After discussing both options with her she has opted for the open approach. - Discussed with her then the plan for an open umbilical hernia repair and  reviewed the surgery at length with her including the planned incision, risks of bleeding, infection, injury to surrounding structures, the use of mesh to reinforce the repair, that this will be an outpatient surgery, postoperative activity restrictions, pain control, and she is willing to proceed. - The patient would rather have surgery sooner rather than later.  We will schedule her for surgery on 01/10/2025.  All of her questions have been answered.  I spent 40 minutes dedicated to the care of this patient on the date of this encounter to include pre-visit review of records, face-to-face time with the patient discussing diagnosis and management, and any post-visit coordination of care.   Aloysius Sheree Plant, MD [facility]        [1] No Known Allergies  "

## 2025-01-06 NOTE — Patient Instructions (Signed)
 You have requested for your Umbilical Hernia be repaired. This will be scheduled with Dr. Aleen Campi at [facility].   If you are on any injectable weight loss medication, you will need to stop taking your GLP-1 injectable (weight loss) medications 8 days before your surgery to avoid any complications with anesthesia.   Please see your (blue)pre-care sheet for information. Our surgery scheduler will call you to verify surgery date and to go over information.   You will need to arrange to be off work for 1-2 weeks but will have to have a lifting restriction of no more than 15 lbs for 6 weeks following your surgery. If you have FMLA or disability paperwork that needs filled out you may drop this off at our office or this can be faxed to [phone].     Umbilical Hernia, Adult A hernia is a bulge of tissue that pushes through an opening between muscles. An umbilical hernia happens in the abdomen, near the belly button (umbilicus). The hernia may contain tissues from the small intestine, large intestine, or fatty tissue covering the intestines (omentum). Umbilical hernias in adults tend to get worse over time, and they require surgical treatment. There are several types of umbilical hernias. You may have: A hernia located just above or below the umbilicus (indirect hernia). This is the most common type of umbilical hernia in adults. A hernia that forms through an opening formed by the umbilicus (direct hernia). A hernia that comes and goes (reducible hernia). A reducible hernia may be visible only when you strain, lift something heavy, or cough. This type of hernia can be pushed back into the abdomen (reduced). A hernia that traps abdominal tissue inside the hernia (incarcerated hernia). This type of hernia cannot be reduced. A hernia that cuts off blood flow to the tissues inside the hernia (strangulated hernia). The tissues can start to die if this happens. This type of hernia  requires emergency treatment.  What are the causes? An umbilical hernia happens when tissue inside the abdomen presses on a weak area of the abdominal muscles. What increases the risk? You may have a greater risk of this condition if you: Are obese. Have had several pregnancies. Have a buildup of fluid inside your abdomen (ascites). Have had surgery that weakens the abdominal muscles.  What are the signs or symptoms? The main symptom of this condition is a painless bulge at or near the belly button. A reducible hernia may be visible only when you strain, lift something heavy, or cough. Other symptoms may include: Dull pain. A feeling of pressure.  Symptoms of a strangulated hernia may include: Pain that gets increasingly worse. Nausea and vomiting. Pain when pressing on the hernia. Skin over the hernia becoming red or purple. Constipation. Blood in the stool.  How is this diagnosed? This condition may be diagnosed based on: A physical exam. You may be asked to cough or strain while standing. These actions increase the pressure inside your abdomen and force the hernia through the opening in your muscles. Your health care provider may try to reduce the hernia by pressing on it. Your symptoms and medical history.  How is this treated? Surgery is the only treatment for an umbilical hernia. Surgery for a strangulated hernia is done as soon as possible. If you have a small hernia that is not incarcerated, you may need to lose weight before having surgery. Follow these instructions at home: Lose weight, if told by your health care provider.  Do not try to push the hernia back in. Watch your hernia for any changes in color or size. Tell your health care provider if any changes occur. You may need to avoid activities that increase pressure on your hernia. Do not lift anything that is heavier than 10 lb (4.5 kg) until your health care provider says that this is safe. Take over-the-counter  and prescription medicines only as told by your health care provider. Keep all follow-up visits as told by your health care provider. This is important. Contact a health care provider if: Your hernia gets larger. Your hernia becomes painful. Get help right away if: You develop sudden, severe pain near the area of your hernia. You have pain as well as nausea or vomiting. You have pain and the skin over your hernia changes color. You develop a fever. This information is not intended to replace advice given to you by your health care provider. Make sure you discuss any questions you have with your health care provider. Document Released: 05/09/2016 Document Revised: 08/10/2016 Document Reviewed: 05/09/2016 Elsevier Interactive Patient Education  [employer].

## 2025-01-06 NOTE — Progress Notes (Addendum)
 " 01/06/2025  Reason for Visit: Umbilical hernia  Requesting Provider: Ginger Patrick, FNP  History of Present Illness: Misty Walker is a 69 y.o. female presenting for evaluation of an umbilical hernia.  The patient reports having had a prior laparoscopic procedure about 40 years ago to evaluate for infertility issues.  She reports incision was at the umbilicus.  She reports recently noticing area of bulging at the umbilicus.  However she denies any abdominal pain in the area.  She reports sometimes some pulling or tightness sensation depending how she is moving but denies any pressure or tenderness or pain at the umbilicus.  Denies any nausea or vomiting.  She saw her PCP recently on 12/06/2024 and was diagnosed with a reducible umbilical hernia.  She had an ultrasound of the abdomen on 12/13/2024 which showed a small umbilical hernia defect containing small bowel.  The patient denies any other abdominal surgeries.  Past Medical History: Past Medical History:  Diagnosis Date   Hyperlipidemia      Past Surgical History: Past Surgical History:  Procedure Laterality Date   DIAGNOSTIC LAPAROSCOPY  1980   laparoscopy in early 1980s    Home Medications: Prior to Admission medications  Medication Sig Start Date End Date Taking? Authorizing Provider  atenolol  (TENORMIN ) 25 MG tablet TAKE 1 TABLET BY MOUTH DAILY 10/06/24  Yes Dugal, Tabitha, FNP  FLUoxetine  (PROZAC ) 20 MG capsule TAKE 1 CAPSULE BY MOUTH DAILY 01/04/25  Yes Dugal, Tabitha, FNP  Multiple Vitamin (MULTIVITAMIN ADULT PO) Take 1 tablet by mouth daily. Rainbow lite   Yes [provider]  Probiotic Product (PROBIOTIC ADVANCED PO) Take 1 tablet by mouth daily.   Yes [provider]  rosuvastatin  (CRESTOR ) 5 MG tablet Take 1 tablet (5 mg total) by mouth daily. 07/27/24  Yes Dugal, Tabitha, FNP  Vitamin D -Vitamin K (VITAMIN K2-VITAMIN D3 PO) Take 1,000 Units by mouth 3 (three) times a week.   Yes [provider]   CALCIUM  PO Take 600 mg by mouth daily.    [provider]  MAGNESIUM CITRATE PO Take 200 mg by mouth daily.    [provider]  Omega-3 Fatty Acids (OMEGA-3 PO) Take 335 mg by mouth daily. [employer]    [provider]    Allergies: Allergies[1]  Social History:  reports that she has never smoked. She has never used smokeless tobacco. She reports that she does not currently use alcohol. She reports that she does not use drugs.   Family History: Family History  Problem Relation Age of Onset   Hypertension Mother    Breast cancer Neg Hx     Review of Systems: Review of Systems  Constitutional:  Negative for chills and fever.  HENT:  Negative for hearing loss.   Respiratory:  Negative for shortness of breath.   Cardiovascular:  Negative for chest pain.  Gastrointestinal:  Negative for abdominal pain, nausea and vomiting.  Genitourinary:  Negative for dysuria.  Musculoskeletal:  Negative for myalgias.  Skin:  Negative for rash.  Neurological:  Negative for dizziness.  Psychiatric/Behavioral:  Negative for depression.     Physical Exam BP (!) 145/72   Pulse 75   Temp 98.5 F (36.9 C) (Oral)   Ht 5' 7 (1.702 m)   Wt 184 lb 12.8 oz (83.8 kg)   SpO2 97%   BMI 28.94 kg/m  CONSTITUTIONAL: No acute distress, well-nourished HEENT:  Normocephalic, atraumatic, extraocular motion intact. NECK: Trachea is midline, and there is no jugular venous distension.  RESPIRATORY:  Lungs are clear, and breath sounds are equal bilaterally. Normal respiratory effort without pathologic use of accessory muscles. CARDIOVASCULAR: Heart is regular without murmurs, gallops, or rubs. GI: The abdomen is soft, nondistended, nontender to palpation.  The patient reports of some pressure soreness at the umbilicus with deep palpation.  The patient has a reducible umbilical hernia that is about 2 cm in size at the site of prior umbilical scar from her laparoscopy.  There are no  inguinal hernias on either side.  MUSCULOSKELETAL:  Normal muscle strength and tone in all four extremities.  No peripheral edema or cyanosis. SKIN: Skin turgor is normal. There are no pathologic skin lesions.  NEUROLOGIC:  Motor and sensation is grossly normal.  Cranial nerves are grossly intact. PSYCH:  Alert and oriented to person, place and time. Affect is normal.  Laboratory Analysis: Labs from 12/06/2024: Sodium 137, potassium 4.1, chloride 99, CO2 29, BUN 13, creatinine 0.73.  LFTs within normal limits.  WBC 9, hemoglobin 14.4, hematocrit 42.5, platelets 311.  Imaging: Ultrasound abdomen on 12/13/2024: IMPRESSION: 4.4 x 1.8 x 2.9 cm umbilical hernia, containing bowel and fat  Assessment and Plan: This is a 69 y.o. female with a reducible umbilical hernia.  - Discussed with the patient the findings on her ultrasound as well as on exam today.  I think the measurement on the ultrasound of 4.4 cm is regarding the hernia sac rather than the hernia defect itself.  The defect itself is about 2 cm in size.  Discussed with the patient the different categories of hernias ranging from reducible, 2 incarcerated, to strangulated.  In her case, her hernia is reducible, however, it does contain a knuckle of small bowel.  Discussed with her that even though she is otherwise asymptomatic from the hernia, my main concern with a hernia containing bowel is the potential progression to something more concerning which would require more urgent surgery.  As such, I would recommend to proceed with surgical repair of her hernia.  However, given that she is asymptomatic, we do have some flexibility and timing.  I discussed with the patient the potential options for surgery including open umbilical hernia repair versus robotic/laparoscopic umbilical hernia repair.  After discussing both options with her she has opted for the open approach. - Discussed with her then the plan for an open umbilical hernia repair and  reviewed the surgery at length with her including the planned incision, risks of bleeding, infection, injury to surrounding structures, the use of mesh to reinforce the repair, that this will be an outpatient surgery, postoperative activity restrictions, pain control, and she is willing to proceed. - The patient would rather have surgery sooner rather than later.  We will schedule her for surgery on 01/10/2025.  All of her questions have been answered.  I spent 40 minutes dedicated to the care of this patient on the date of this encounter to include pre-visit review of records, face-to-face time with the patient discussing diagnosis and management, and any post-visit coordination of care.   Aloysius Sheree Plant, MD [facility]        [1] No Known Allergies  "

## 2025-01-06 NOTE — Patient Instructions (Addendum)
 Your procedure is scheduled on: 01/10/25 - Tuesday Report to the Registration Desk on the 1st floor of the [facility]. To find out your arrival time, please call [phone] between 1PM - 3PM on: 01/09/25 - Monday If your arrival time is 6:00 am, do not arrive before that time as the [facility] entrance doors do not open until 6:00 am.  REMEMBER: Instructions that are not followed completely may result in serious medical risk, up to and including death; or upon the discretion of your surgeon and anesthesiologist your surgery may need to be rescheduled.  Do not eat food after midnight the night before surgery.  No gum chewing or hard candies.  You may however, drink CLEAR liquids up to 2 hours before you are scheduled to arrive for your surgery. Do not drink anything within 2 hours of your scheduled arrival time.  Clear liquids include: - water  - apple juice without pulp - gatorade (not RED colors) - black coffee or tea (Do NOT add milk or creamers to the coffee or tea) Do NOT drink anything that is not on this list.  One week prior to surgery: Stop Anti-inflammatories (NSAIDS) such as Advil, Aleve, Ibuprofen, Motrin, Naproxen, Naprosyn and Aspirin based products such as Excedrin, Goody's Powder, BC Powder. You may take Tylenol if needed for pain up until the day of surgery.  Stop ANY OVER THE COUNTER supplements until after surgery.  ON THE DAY OF SURGERY ONLY TAKE THESE MEDICATIONS WITH SIPS OF WATER:  FLUoxetine  (PROZAC )  atenolol  (TENORMIN )   No Alcohol for 24 hours before or after surgery.  No Smoking including e-cigarettes for 24 hours before surgery.  No chewable tobacco products for at least 6 hours before surgery.  No nicotine patches on the day of surgery.  Do not use any recreational drugs for at least a week (preferably 2 weeks) before your surgery.  Please be advised that the combination of cocaine and anesthesia may have negative outcomes, up to and  including death. If you test positive for cocaine, your surgery will be cancelled.  On the morning of surgery brush your teeth with toothpaste and water, you may rinse your mouth with mouthwash if you wish. Do not swallow any toothpaste or mouthwash.  Use CHG Soap or wipes as directed on instruction sheet.  Do not wear jewelry, make-up, hairpins, clips or nail polish.  For welded (permanent) jewelry: bracelets, anklets, waist bands, etc.  Please have this removed prior to surgery.  If it is not removed, there is a chance that hospital personnel will need to cut it off on the day of surgery.  Do not wear lotions, powders, or perfumes.   Do not shave body hair from the neck down 48 hours before surgery.  Contact lenses, hearing aids and dentures may not be worn into surgery.  Do not bring valuables to the hospital. [facility] is not responsible for any missing/lost belongings or valuables.   Notify your doctor if there is any change in your medical condition (cold, fever, infection).  Wear comfortable clothing (specific to your surgery type) to the hospital.  After surgery, you can help prevent lung complications by doing breathing exercises.  Take deep breaths and cough every 1-2 hours. Your doctor may order a device called an Incentive Spirometer to help you take deep breaths.  When coughing or sneezing, hold a pillow firmly against your incision with both hands. This is called splinting. Doing this helps protect your incision. It also decreases  belly discomfort.  If you are being admitted to the hospital overnight, leave your suitcase in the car. After surgery it may be brought to your room.  In case of increased patient census, it may be necessary for you, the patient, to continue your postoperative care in the Same Day Surgery department.  If you are being discharged the day of surgery, you will not be allowed to drive home. You will need a responsible individual to drive you  home and stay with you for 24 hours after surgery.   If you are taking public transportation, you will need to have a responsible individual with you.  Please call the Pre-admissions Testing Dept. at [phone] if you have any questions about these instructions.  Surgery Visitation Policy:  Patients having surgery or a procedure may have two visitors.  Children under the age of 19 must have an adult with them who is not the patient.  Inpatient Visitation:    Visiting hours are 7 a.m. to 8 p.m. Up to four visitors are allowed at one time in a patient room. The visitors may rotate out with other people during the day.  One visitor age 59 or older may stay with the patient overnight and must be in the room by 8 p.m.   Merchandiser, Retail to address health-related social needs:  [email or web address]                                                                                                            Preparing for Surgery with CHLORHEXIDINE GLUCONATE (CHG) Soap  Chlorhexidine Gluconate (CHG) Soap  o An antiseptic cleaner that kills germs and bonds with the skin to continue killing germs even after washing  o Used for showering the night before surgery and morning of surgery  Before surgery, you can play an important role by reducing the number of germs on your skin.  CHG (Chlorhexidine gluconate) soap is an antiseptic cleanser which kills germs and bonds with the skin to continue killing germs even after washing.  Please do not use if you have an allergy to CHG or antibacterial soaps. If your skin becomes reddened/irritated stop using the CHG.  1. Shower the NIGHT BEFORE SURGERY with CHG soap.  2. If you choose to wash your hair, wash your hair first as usual with your normal shampoo.  3. After shampooing, rinse your hair and body thoroughly to remove the shampoo.  4. Use CHG as you would any other liquid soap. You can apply CHG directly to the skin  and wash gently with a clean washcloth.  5. Apply the CHG soap to your body only from the neck down. Do not use on open wounds or open sores. Avoid contact with your eyes, ears, mouth, and genitals (private parts). Wash face and genitals (private parts) with your normal soap.  6. Wash thoroughly, paying special attention to the area where your surgery will be performed.  7. Thoroughly rinse your body with warm water.  8. Do not shower/wash with your normal soap after  using and rinsing off the CHG soap.  9. Do not use lotions, oils, etc., after showering with CHG.  10. Pat yourself dry with a clean towel.  11. Wear clean pajamas to bed the night before surgery.  12. Place clean sheets on your bed the night of your shower and do not sleep with pets.  13. Do not apply any deodorants/lotions/powders.  14. Please wear clean clothes to the hospital.  15. Remember to brush your teeth with your regular toothpaste.

## 2025-01-09 ENCOUNTER — Encounter
Admission: RE | Admit: 2025-01-09 | Discharge: 2025-01-09 | Disposition: A | Source: Ambulatory Visit | Attending: Surgery

## 2025-01-09 DIAGNOSIS — I341 Nonrheumatic mitral (valve) prolapse: Secondary | ICD-10-CM

## 2025-01-09 DIAGNOSIS — Z01818 Encounter for other preprocedural examination: Secondary | ICD-10-CM | POA: Diagnosis present

## 2025-01-09 DIAGNOSIS — Z01810 Encounter for preprocedural cardiovascular examination: Secondary | ICD-10-CM | POA: Insufficient documentation

## 2025-01-10 ENCOUNTER — Ambulatory Visit

## 2025-01-10 ENCOUNTER — Ambulatory Visit
Admission: RE | Admit: 2025-01-10 | Discharge: 2025-01-10 | Disposition: A | Discharge status: Home / Self Care | Attending: Surgery | Admitting: Surgery

## 2025-01-10 ENCOUNTER — Other Ambulatory Visit: Payer: Self-pay

## 2025-01-10 ENCOUNTER — Encounter
Admission: RE | Disposition: A | Discharge status: Home / Self Care | Payer: Self-pay | Source: Home / Self Care | Attending: Surgery

## 2025-01-10 DIAGNOSIS — K429 Umbilical hernia without obstruction or gangrene: Secondary | ICD-10-CM | POA: Diagnosis not present

## 2025-01-10 DIAGNOSIS — M1990 Unspecified osteoarthritis, unspecified site: Secondary | ICD-10-CM | POA: Diagnosis not present

## 2025-01-10 HISTORY — PX: UMBILICAL HERNIA REPAIR: SHX196

## 2025-01-10 MED ORDER — 0.9 % SODIUM CHLORIDE (POUR BTL) OPTIME
TOPICAL | Status: DC | PRN
  Administered 2025-01-10: 500 mL

## 2025-01-10 MED ORDER — DEXAMETHASONE SOD PHOSPHATE PF 10 MG/ML IJ SOLN
INTRAMUSCULAR | Status: AC
  Filled 2025-01-10: qty 1

## 2025-01-10 MED ORDER — BUPIVACAINE-EPINEPHRINE 0.5% -1:200000 IJ SOLN
INTRAMUSCULAR | Status: DC | PRN
  Administered 2025-01-10: 40 mL via SURGICAL_CAVITY

## 2025-01-10 MED ORDER — BUPIVACAINE LIPOSOME 1.3 % IJ SUSP: 10.0000 mL | Freq: Once | INTRAMUSCULAR | Status: DC

## 2025-01-10 MED ORDER — MIDAZOLAM HCL 2 MG/2ML IJ SOLN
INTRAMUSCULAR | Status: AC
  Filled 2025-01-10: qty 2

## 2025-01-10 MED ORDER — ACETAMINOPHEN 500 MG PO TABS: 1000.0000 mg | ORAL_TABLET | Freq: Four times a day (QID) | ORAL | Status: DC | PRN

## 2025-01-10 MED ORDER — OXYCODONE HCL 5 MG/5ML PO SOLN: 5.0000 mg | Freq: Once | ORAL | Status: DC | PRN

## 2025-01-10 MED ORDER — PROPOFOL 10 MG/ML IV BOLUS
INTRAVENOUS | Status: AC
  Filled 2025-01-10: qty 20

## 2025-01-10 MED ORDER — ORAL CARE MOUTH RINSE: 15.0000 mL | Freq: Once | OROMUCOSAL | Status: AC

## 2025-01-10 MED ORDER — CHLORHEXIDINE GLUCONATE 0.12 % MT SOLN
15.0000 mL | Freq: Once | OROMUCOSAL | Status: AC
  Administered 2025-01-10: 15 mL via OROMUCOSAL

## 2025-01-10 MED ORDER — FENTANYL CITRATE (PF) 100 MCG/2ML IJ SOLN
INTRAMUSCULAR | Status: DC | PRN
  Administered 2025-01-10: 50 ug via INTRAVENOUS
  Administered 2025-01-10 (×2): 25 ug via INTRAVENOUS

## 2025-01-10 MED ORDER — OXYCODONE HCL 5 MG PO TABS: 5.0000 mg | ORAL_TABLET | ORAL | 0 refills | Status: DC | PRN

## 2025-01-10 MED ORDER — BUPIVACAINE-EPINEPHRINE (PF) 0.5% -1:200000 IJ SOLN
INTRAMUSCULAR | Status: AC
  Filled 2025-01-10: qty 10

## 2025-01-10 MED ORDER — GABAPENTIN 300 MG PO CAPS
ORAL_CAPSULE | ORAL | Status: AC
  Filled 2025-01-10: qty 1

## 2025-01-10 MED ORDER — ROCURONIUM BROMIDE 100 MG/10ML IV SOLN
INTRAVENOUS | Status: DC | PRN
  Administered 2025-01-10: 50 mg via INTRAVENOUS
  Administered 2025-01-10: 10 mg via INTRAVENOUS

## 2025-01-10 MED ORDER — MIDAZOLAM HCL (PF) 2 MG/2ML IJ SOLN
INTRAMUSCULAR | Status: DC | PRN
  Administered 2025-01-10: 1 mg via INTRAVENOUS

## 2025-01-10 MED ORDER — CEFAZOLIN SODIUM-DEXTROSE 2-4 GM/100ML-% IV SOLN
INTRAVENOUS | Status: AC
  Filled 2025-01-10: qty 100

## 2025-01-10 MED ORDER — ACETAMINOPHEN 500 MG PO TABS
ORAL_TABLET | ORAL | Status: AC
  Filled 2025-01-10: qty 2

## 2025-01-10 MED ORDER — ONDANSETRON HCL 4 MG/2ML IJ SOLN
INTRAMUSCULAR | Status: AC
  Filled 2025-01-10: qty 2

## 2025-01-10 MED ORDER — CEFAZOLIN SODIUM-DEXTROSE 2-3 GM-%(50ML) IV SOLR
INTRAVENOUS | Status: DC | PRN
  Administered 2025-01-10: 2 g via INTRAVENOUS

## 2025-01-10 MED ORDER — CHLORHEXIDINE GLUCONATE CLOTH 2 % EX PADS
6.0000 | MEDICATED_PAD | Freq: Once | CUTANEOUS | Status: AC
  Administered 2025-01-10: 6 via TOPICAL

## 2025-01-10 MED ORDER — OXYCODONE HCL 5 MG PO TABS: 5.0000 mg | ORAL_TABLET | Freq: Once | ORAL | Status: DC | PRN

## 2025-01-10 MED ORDER — PROPOFOL 10 MG/ML IV BOLUS
INTRAVENOUS | Status: DC | PRN
  Administered 2025-01-10: 130 mg via INTRAVENOUS

## 2025-01-10 MED ORDER — FENTANYL CITRATE (PF) 100 MCG/2ML IJ SOLN: 25.0000 ug | INTRAMUSCULAR | Status: DC | PRN

## 2025-01-10 MED ORDER — ONDANSETRON HCL 4 MG/2ML IJ SOLN
INTRAMUSCULAR | Status: DC | PRN
  Administered 2025-01-10: 4 mg via INTRAVENOUS

## 2025-01-10 MED ORDER — FENTANYL CITRATE (PF) 100 MCG/2ML IJ SOLN
INTRAMUSCULAR | Status: AC
  Filled 2025-01-10: qty 2

## 2025-01-10 MED ORDER — CHLORHEXIDINE GLUCONATE 0.12 % MT SOLN
OROMUCOSAL | Status: AC
  Filled 2025-01-10: qty 15

## 2025-01-10 MED ORDER — LACTATED RINGERS IV SOLN: INTRAVENOUS | Status: DC

## 2025-01-10 MED ORDER — ACETAMINOPHEN 500 MG PO TABS
1000.0000 mg | ORAL_TABLET | ORAL | Status: AC
  Administered 2025-01-10: 1000 mg via ORAL

## 2025-01-10 MED ORDER — BUPIVACAINE-EPINEPHRINE (PF) 0.5% -1:200000 IJ SOLN
INTRAMUSCULAR | Status: AC
  Filled 2025-01-10: qty 30

## 2025-01-10 MED ORDER — BUPIVACAINE LIPOSOME 1.3 % IJ SUSP
INTRAMUSCULAR | Status: AC
  Filled 2025-01-10: qty 10

## 2025-01-10 MED ORDER — GABAPENTIN 300 MG PO CAPS
300.0000 mg | ORAL_CAPSULE | ORAL | Status: AC
  Administered 2025-01-10: 300 mg via ORAL

## 2025-01-10 MED ORDER — SUGAMMADEX SODIUM 200 MG/2ML IV SOLN
INTRAVENOUS | Status: DC | PRN
  Administered 2025-01-10: 200 mg via INTRAVENOUS

## 2025-01-10 MED ORDER — LIDOCAINE HCL (PF) 2 % IJ SOLN
INTRAMUSCULAR | Status: AC
  Filled 2025-01-10: qty 5

## 2025-01-10 MED ORDER — DEXAMETHASONE SOD PHOSPHATE PF 10 MG/ML IJ SOLN
INTRAMUSCULAR | Status: DC | PRN
  Administered 2025-01-10: 5 mg via INTRAVENOUS

## 2025-01-10 MED ORDER — ROCURONIUM BROMIDE 10 MG/ML (PF) SYRINGE
PREFILLED_SYRINGE | INTRAVENOUS | Status: AC
  Filled 2025-01-10: qty 10

## 2025-01-10 MED ORDER — LIDOCAINE HCL (CARDIAC) PF 100 MG/5ML IV SOSY
PREFILLED_SYRINGE | INTRAVENOUS | Status: DC | PRN
  Administered 2025-01-10: 80 mg via INTRAVENOUS

## 2025-01-10 MED ORDER — IBUPROFEN 600 MG PO TABS: 600.0000 mg | ORAL_TABLET | Freq: Three times a day (TID) | ORAL | 1 refills | Status: DC | PRN

## 2025-01-10 MED ORDER — EPHEDRINE SULFATE (PRESSORS) 25 MG/5ML IV SOSY
PREFILLED_SYRINGE | INTRAVENOUS | Status: DC | PRN
  Administered 2025-01-10 (×2): 7.5 mg via INTRAVENOUS

## 2025-01-10 MED ORDER — CEFAZOLIN SODIUM-DEXTROSE 2-4 GM/100ML-% IV SOLN: 2.0000 g | INTRAVENOUS | Status: DC

## 2025-01-10 NOTE — Transfer of Care (Signed)
 Immediate Anesthesia Transfer of Care Note  Patient: Misty Walker  Procedure(s) Performed: REPAIR, HERNIA, UMBILICAL, ADULT (Abdomen)  Patient Location: PACU  Anesthesia Type:General  Level of Consciousness: drowsy  Airway & Oxygen Therapy: Patient Spontanous Breathing and Patient connected to face mask oxygen  Post-op Assessment: Report given to RN and Post -op Vital signs reviewed and stable  Post vital signs: Reviewed and stable  Last Vitals:  Vitals Value Taken Time  BP 131/69 01/10/25 15:42  Temp    Pulse 77 01/10/25 15:45  Resp 15 01/10/25 15:45  SpO2 100 % 01/10/25 15:45  Vitals shown include unfiled device data.  Last Pain: There were no vitals filed for this visit.       Complications: No notable events documented.

## 2025-01-10 NOTE — Discharge Instructions (Signed)

## 2025-01-10 NOTE — Op Note (Signed)
 Procedure Date:  01/10/2025  Pre-operative Diagnosis:  Reducible umbilical hernia  Post-operative Diagnosis: Reducible umbilical hernia, 2 cm defect.  Procedure:  Umbilical hernia repair with mesh  Surgeon:  Aloysius Sheree Plant, MD  Anesthesia:  General endotracheal  Estimated Blood Loss:  10 ml  Specimens:  None  Complications:  None  Indications for Procedure:  This is a 69 y.o. female who presents with a reducible umbilical hernia, concerning for containing bowel per U/S imaging.  The risks of bleeding, abscess or infection, injury to surrounding structures, and need for further procedures were all discussed with the patient and was willing to proceed.  Description of Procedure: The patient was correctly identified in the preoperative area and brought into the operating room.  The patient was placed supine with VTE prophylaxis in place.  Appropriate time-outs were performed.  Anesthesia was induced and the patient was intubated.  Appropriate antibiotics were infused.  The abdomen was prepped and draped in a sterile fashion.  An infraumbilical curvilinear 5 cm incision was made and cautery was used to dissect down the subcutaneous tissue along the umbilical stalk.  Kelly forceps were used to dissect the umbilical stalk and it was divided at the fascia using cautery.  This allowed visualization of the hernia defect.  The hernia contained preperitoneal fat, and at least on our inspection, there was no bowel involved.  The hernia was reduced without complications and the preperitoneal fat and hernia sac were resected.  The fascial edges were cleaned using cautery.  An 8 cm Ventralex ST hernia patch was introduced via the defect.  Initially, the mesh would not splay open appropriately, with an area where the mesh remained folded into itself.  A new mesh of the same size was brought in and again the same issue.  Eventually, the mesh was able to splay open without any folds and without any abdominal  contents in the way.  The tails were secured to the fascia using 2-0 Prolene sutures.  The fascia was then closed with 0 Ethibond sutures, incorporating a layer of the mesh with each suture.  The umbilical stalk was then reattached to the fascia using 2-0 Vicryl. The wound was irrigated and local anesthetic was infused.  The wound was then closed in layers using 3-0 Vicryl and 4-0 Monocryl. The incision was cleaned and sealed with DermaBond.  The patient was emerged from anesthesia and extubated and brought to the recovery room for further management.  The patient tolerated the procedure well and all counts were correct at the end of the case.   Aloysius Sheree Plant, MD

## 2025-01-10 NOTE — Anesthesia Preprocedure Evaluation (Signed)
"                                    Anesthesia Evaluation  Patient identified by MRN, date of birth, ID band Patient awake    Reviewed: Allergy & Precautions, NPO status , Patient's Chart, lab work & pertinent test results  History of Anesthesia Complications Negative for: history of anesthetic complications  Airway Mallampati: III  TM Distance: >3 FB Neck ROM: full    Dental no notable dental hx.    Pulmonary neg pulmonary ROS   Pulmonary exam normal        Cardiovascular Normal cardiovascular exam+ dysrhythmias (has been taking atenolol  since her 20's for palpitations) + Valvular Problems/Murmurs MVP   Echo & EKG reviewed   Neuro/Psych negative neurological ROS  negative psych ROS   GI/Hepatic negative GI ROS, Neg liver ROS,,,  Endo/Other  negative endocrine ROS    Renal/GU      Musculoskeletal  (+) Arthritis ,    Abdominal   Peds  Hematology negative hematology ROS (+)   Anesthesia Other Findings Past Medical History: No date: Arthritis No date: Cancer (HCC)     Comment:  skin No date: Depression No date: Hyperlipidemia No date: Pneumonia No date: Umbilical hernia  Past Surgical History: No date: COLONOSCOPY 1980: DIAGNOSTIC LAPAROSCOPY     Comment:  laparoscopy in early 1980s  BMI    Body Mass Index: 28.94 kg/m      Reproductive/Obstetrics negative OB ROS                              Anesthesia Physical Anesthesia Plan  ASA: 2  Anesthesia Plan: General ETT   Post-op Pain Management: Tylenol  PO (pre-op)*, Gabapentin  PO (pre-op)*, Toradol IV (intra-op)* and Dilaudid IV   Induction: Intravenous  PONV Risk Score and Plan: 3 and Ondansetron , Dexamethasone  and Treatment may vary due to age or medical condition  Airway Management Planned: Oral ETT  Additional Equipment:   Intra-op Plan:   Post-operative Plan: Extubation in OR  Informed Consent: I have reviewed the patients History and Physical,  chart, labs and discussed the procedure including the risks, benefits and alternatives for the proposed anesthesia with the patient or authorized representative who has indicated his/her understanding and acceptance.     Dental Advisory Given  Plan Discussed with: Anesthesiologist, CRNA and Surgeon  Anesthesia Plan Comments: (Patient consented for risks of anesthesia including but not limited to:  - adverse reactions to medications - damage to eyes, teeth, lips or other oral mucosa - nerve damage due to positioning  - sore throat or hoarseness - Damage to heart, brain, nerves, lungs, other parts of body or loss of life  Patient voiced understanding and assent.)         Anesthesia Quick Evaluation  "

## 2025-01-10 NOTE — Anesthesia Procedure Notes (Addendum)
 Procedure Name: Intubation Date/Time: 01/10/2025 2:07 PM  Performed by: Rosine Shona Jansky, CRNAPre-anesthesia Checklist: Patient identified, Emergency Drugs available, Suction available and Patient being monitored Patient Re-evaluated:Patient Re-evaluated prior to induction Oxygen Delivery Method: Circle system utilized Preoxygenation: Pre-oxygenation with 100% oxygen Induction Type: IV induction Ventilation: Mask ventilation without difficulty Laryngoscope Size: McGrath and 3 Grade View: Grade II Tube type: Oral Tube size: 7.0 mm Number of attempts: 2 Airway Equipment and Method: Stylet Placement Confirmation: ETT inserted through vocal cords under direct vision, positive ETCO2 and breath sounds checked- equal and bilateral Tube secured with: Tape Dental Injury: Teeth and Oropharynx as per pre-operative assessment

## 2025-01-10 NOTE — Interval H&P Note (Signed)
 History and Physical Interval Note:  01/10/2025 1:29 PM  Misty Walker  has presented today for surgery, with the diagnosis of umbilical hernia reducible less 3 cm.  The various methods of treatment have been discussed with the patient and family. After consideration of risks, benefits and other options for treatment, the patient has consented to  Procedures: REPAIR, HERNIA, UMBILICAL, ADULT (N/A) as a surgical intervention.  The patient's history has been reviewed, patient examined, no change in status, stable for surgery.  I have reviewed the patient's chart and labs.  Questions were answered to the patient's satisfaction.     Misty Walker

## 2025-01-11 ENCOUNTER — Encounter: Payer: Self-pay | Admitting: Surgery

## 2025-01-11 NOTE — Anesthesia Postprocedure Evaluation (Signed)
"   Anesthesia Post Note  Patient: Misty Walker  Procedure(s) Performed: REPAIR, HERNIA, UMBILICAL, ADULT (Abdomen)  Patient location during evaluation: PACU Anesthesia Type: General Level of consciousness: awake and alert Pain management: pain level controlled Vital Signs Assessment: post-procedure vital signs reviewed and stable Respiratory status: spontaneous breathing, nonlabored ventilation, respiratory function stable and patient connected to nasal cannula oxygen Cardiovascular status: blood pressure returned to baseline and stable Postop Assessment: no apparent nausea or vomiting Anesthetic complications: no   No notable events documented.   Last Vitals:  Vitals:   01/10/25 1600 01/10/25 1630  BP: 137/69 107/65  Pulse: 74 75  Resp: 20 18  Temp:  36.6 C  SpO2: 96% 97%    Last Pain:  Vitals:   01/10/25 1630  TempSrc: Temporal  PainSc: 0-No pain                 [name]      "

## 2025-01-23 ENCOUNTER — Encounter: Admitting: Surgery

## 2025-01-25 ENCOUNTER — Encounter: Payer: Self-pay | Admitting: Surgery

## 2025-01-25 ENCOUNTER — Ambulatory Visit: Admitting: Surgery

## 2025-01-25 VITALS — BP 127/82 | HR 73 | Ht 67.0 in | Wt 186.0 lb

## 2025-01-25 DIAGNOSIS — K429 Umbilical hernia without obstruction or gangrene: Secondary | ICD-10-CM

## 2025-01-25 DIAGNOSIS — Z09 Encounter for follow-up examination after completed treatment for conditions other than malignant neoplasm: Secondary | ICD-10-CM | POA: Diagnosis not present

## 2025-01-25 NOTE — Progress Notes (Signed)
 " 01/25/2025  History of Present Illness: Misty Walker is a 69 y.o. female status post open umbilical hernia repair on 01/10/2025.  Hernia defect was 2 cm and 8 cm Bard hernia patch mesh was used.  Patient presents today for follow-up.  She reports that she has been doing well and denies any abdominal pain.  She only took ibuprofen  for a few days.  Denies any troubles with the incision.  Having normal p.o. diet and normal bowel function.  Past Medical History: Past Medical History:  Diagnosis Date   Anxiety    Arthritis    Cancer (HCC)    skin   Depression    Hyperlipidemia    Osteoporosis    Pneumonia    Umbilical hernia      Past Surgical History: Past Surgical History:  Procedure Laterality Date   COLONOSCOPY     DIAGNOSTIC LAPAROSCOPY  1980   laparoscopy in early 1s   UMBILICAL HERNIA REPAIR N/A 01/10/2025   Procedure: REPAIR, HERNIA, UMBILICAL, ADULT;  Surgeon: Desiderio Schanz, MD;  Location: [facility] ORS;  Service: General;  Laterality: N/A;    Home Medications: Prior to Admission medications  Medication Sig Start Date End Date Taking? Authorizing Provider  atenolol  (TENORMIN ) 25 MG tablet TAKE 1 TABLET BY MOUTH DAILY 10/06/24   Dugal, Tabitha, FNP  CALCIUM  PO Take 600 mg by mouth daily.    [provider]  FLUoxetine  (PROZAC ) 20 MG capsule TAKE 1 CAPSULE BY MOUTH DAILY 01/04/25   Dugal, Tabitha, FNP  MAGNESIUM CITRATE PO Take 200 mg by mouth daily.    [provider]  Multiple Vitamin (MULTIVITAMIN ADULT PO) Take 1 tablet by mouth daily. Rainbow lite    [provider]  Omega-3 Fatty Acids (OMEGA-3 PO) Take 335 mg by mouth daily. [employer]    [provider]  Probiotic Product (PROBIOTIC ADVANCED PO) Take 1 tablet by mouth daily.    [provider]  rosuvastatin  (CRESTOR ) 5 MG tablet Take 1 tablet (5 mg total) by mouth daily. 07/27/24   Corwin Antu, FNP  Vitamin D -Vitamin K (VITAMIN K2-VITAMIN D3 PO) Take 1,000 Units by mouth 3  (three) times a week.    [provider]    Allergies: Allergies[1]  Review of Systems: Review of Systems  Constitutional:  Negative for chills and fever.  Respiratory:  Negative for shortness of breath.   Cardiovascular:  Negative for chest pain.  Gastrointestinal:  Negative for abdominal pain, nausea and vomiting.    Physical Exam BP 127/82   Pulse 73   Ht 5' 7 (1.702 m)   Wt 186 lb (84.4 kg)   SpO2 98%   BMI 29.13 kg/m  CONSTITUTIONAL: No acute distress HEENT:  Normocephalic, atraumatic, extraocular motion intact. RESPIRATORY:  Normal respiratory effort without pathologic use of accessory muscles. CARDIOVASCULAR: Regular rhythm and rate. GI: The abdomen is soft, nondistended, nontender to palpation.  Umbilical incision has a scab along the incision covered by Dermabond.  No evidence of infection or any wound dehiscence at this point.  No evidence of hernia recurrence.   NEUROLOGIC:  Motor and sensation is grossly normal.  Cranial nerves are grossly intact. PSYCH:  Alert and oriented to person, place and time. Affect is normal.   Assessment and Plan: This is a 68 y.o. female status post open umbilical hernia repair.  - Patient overall is healing appropriately.  The umbilical incision has a scab along the incision line but there is currently no evidence of any dehiscence  or infection.  Discussed with patient that as the Dermabond and the scab peels off, the skin edges underneath may have at the small degree of separation.  If that is the case, she can apply dry gauze dressing over the area to keep the area clean but the skin edges should seal up on their own easily. - Reminded patient again of activity restrictions postoperatively. - Follow-up as needed.  I spent 20 minutes dedicated to the care of this patient on the date of this encounter to include pre-visit review of records, face-to-face time with the patient discussing diagnosis and management, and any  post-visit coordination of care.   Aloysius Sheree Plant, MD [facility]         [1] No Known Allergies  "

## 2025-01-25 NOTE — Patient Instructions (Signed)

## 2025-02-06 ENCOUNTER — Ambulatory Visit

## 2025-02-08 ENCOUNTER — Encounter: Admitting: Surgery

## 2025-04-07 ENCOUNTER — Encounter: Admitting: Family

## 2025-12-11 ENCOUNTER — Encounter: Admitting: Family
